# Patient Record
Sex: Female | Born: 1945 | Race: Black or African American | Hispanic: No | Smoking: Former smoker
Health system: Southern US, Community
[De-identification: ages and names within clinical notes are randomized; demographics above are authoritative.]

## PROBLEM LIST (undated history)

## (undated) DIAGNOSIS — R0600 Dyspnea, unspecified: Secondary | ICD-10-CM

## (undated) DIAGNOSIS — J45909 Unspecified asthma, uncomplicated: Secondary | ICD-10-CM

## (undated) DIAGNOSIS — I10 Essential (primary) hypertension: Secondary | ICD-10-CM

## (undated) DIAGNOSIS — M1990 Unspecified osteoarthritis, unspecified site: Secondary | ICD-10-CM

## (undated) DIAGNOSIS — C801 Malignant (primary) neoplasm, unspecified: Secondary | ICD-10-CM

## (undated) DIAGNOSIS — M797 Fibromyalgia: Secondary | ICD-10-CM

## (undated) DIAGNOSIS — G5600 Carpal tunnel syndrome, unspecified upper limb: Secondary | ICD-10-CM

## (undated) DIAGNOSIS — E119 Type 2 diabetes mellitus without complications: Secondary | ICD-10-CM

## (undated) DIAGNOSIS — K5792 Diverticulitis of intestine, part unspecified, without perforation or abscess without bleeding: Secondary | ICD-10-CM

## (undated) DIAGNOSIS — T8859XA Other complications of anesthesia, initial encounter: Secondary | ICD-10-CM

## (undated) HISTORY — PX: ABDOMINAL HYSTERECTOMY: SHX81

## (undated) HISTORY — PX: TONSILLECTOMY: SUR1361

## (undated) HISTORY — PX: CHOLECYSTECTOMY: SHX55

## (undated) HISTORY — PX: KNEE SURGERY: SHX244

---

## 2008-01-22 ENCOUNTER — Ambulatory Visit: Payer: Self-pay | Admitting: Diagnostic Radiology

## 2008-01-22 ENCOUNTER — Emergency Department (HOSPITAL_BASED_OUTPATIENT_CLINIC_OR_DEPARTMENT_OTHER): Admission: EM | Admit: 2008-01-22 | Discharge: 2008-01-22 | Payer: Self-pay | Admitting: Emergency Medicine

## 2010-02-08 ENCOUNTER — Encounter: Payer: Self-pay | Admitting: Internal Medicine

## 2010-05-04 LAB — CBC
HCT: 44.3 % (ref 36.0–46.0)
MCV: 89.7 fL (ref 78.0–100.0)
RBC: 4.93 MIL/uL (ref 3.87–5.11)
WBC: 7.1 10*3/uL (ref 4.0–10.5)

## 2010-05-04 LAB — DIFFERENTIAL
Eosinophils Absolute: 0.1 10*3/uL (ref 0.0–0.7)
Eosinophils Relative: 2 % (ref 0–5)
Lymphs Abs: 2.7 10*3/uL (ref 0.7–4.0)
Monocytes Relative: 7 % (ref 3–12)

## 2010-05-04 LAB — BASIC METABOLIC PANEL
BUN: 12 mg/dL (ref 6–23)
Chloride: 107 mEq/L (ref 96–112)
GFR calc Af Amer: 55 mL/min — ABNORMAL LOW (ref >60)
Potassium: 4 mEq/L (ref 3.5–5.1)

## 2011-11-27 ENCOUNTER — Other Ambulatory Visit: Payer: Self-pay | Admitting: Family Medicine

## 2015-03-24 ENCOUNTER — Encounter: Payer: Self-pay | Admitting: Emergency Medicine

## 2015-03-24 ENCOUNTER — Emergency Department: Payer: Medicare Other

## 2015-03-24 ENCOUNTER — Emergency Department
Admission: EM | Admit: 2015-03-24 | Discharge: 2015-03-25 | Disposition: A | Discharge status: Home / Self Care | Payer: Medicare Other | Attending: Emergency Medicine | Admitting: Emergency Medicine

## 2015-03-24 DIAGNOSIS — J111 Influenza due to unidentified influenza virus with other respiratory manifestations: Secondary | ICD-10-CM | POA: Insufficient documentation

## 2015-03-24 DIAGNOSIS — R509 Fever, unspecified: Secondary | ICD-10-CM | POA: Diagnosis present

## 2015-03-24 DIAGNOSIS — E119 Type 2 diabetes mellitus without complications: Secondary | ICD-10-CM | POA: Insufficient documentation

## 2015-03-24 DIAGNOSIS — I10 Essential (primary) hypertension: Secondary | ICD-10-CM | POA: Diagnosis not present

## 2015-03-24 HISTORY — DX: Unspecified osteoarthritis, unspecified site: M19.90

## 2015-03-24 HISTORY — DX: Carpal tunnel syndrome, unspecified upper limb: G56.00

## 2015-03-24 HISTORY — DX: Diverticulitis of intestine, part unspecified, without perforation or abscess without bleeding: K57.92

## 2015-03-24 HISTORY — DX: Type 2 diabetes mellitus without complications: E11.9

## 2015-03-24 HISTORY — DX: Essential (primary) hypertension: I10

## 2015-03-24 HISTORY — DX: Fibromyalgia: M79.7

## 2015-03-24 LAB — CBC WITH DIFFERENTIAL/PLATELET
BASOS PCT: 1 %
Basophils Absolute: 0 10*3/uL (ref 0–0.1)
EOS ABS: 0 10*3/uL (ref 0–0.7)
EOS PCT: 1 %
HCT: 44.1 % (ref 35.0–47.0)
Hemoglobin: 14.7 g/dL (ref 12.0–16.0)
Lymphocytes Relative: 7 %
Lymphs Abs: 0.5 10*3/uL — ABNORMAL LOW (ref 1.0–3.6)
MCH: 29.2 pg (ref 26.0–34.0)
MCHC: 33.2 g/dL (ref 32.0–36.0)
MCV: 87.8 fL (ref 80.0–100.0)
MONO ABS: 0.9 10*3/uL (ref 0.2–0.9)
MONOS PCT: 13 %
Neutro Abs: 5.8 10*3/uL (ref 1.4–6.5)
Neutrophils Relative %: 78 %
PLATELETS: 180 10*3/uL (ref 150–440)
RBC: 5.03 MIL/uL (ref 3.80–5.20)
RDW: 14.7 % — AB (ref 11.5–14.5)
WBC: 7.3 10*3/uL (ref 3.6–11.0)

## 2015-03-24 LAB — URINALYSIS COMPLETE WITH MICROSCOPIC (ARMC ONLY)
Bacteria, UA: NONE SEEN
Bilirubin Urine: NEGATIVE
Glucose, UA: NEGATIVE mg/dL
Hgb urine dipstick: NEGATIVE
KETONES UR: NEGATIVE mg/dL
LEUKOCYTES UA: NEGATIVE
Nitrite: NEGATIVE
PH: 7 (ref 5.0–8.0)
PROTEIN: NEGATIVE mg/dL
RBC / HPF: NONE SEEN RBC/hpf (ref 0–5)
SPECIFIC GRAVITY, URINE: 1.014 (ref 1.005–1.030)

## 2015-03-24 LAB — COMPREHENSIVE METABOLIC PANEL
ALBUMIN: 4.1 g/dL (ref 3.5–5.0)
ALT: 21 U/L (ref 14–54)
ANION GAP: 9 (ref 5–15)
AST: 24 U/L (ref 15–41)
Alkaline Phosphatase: 63 U/L (ref 38–126)
BILIRUBIN TOTAL: 0.9 mg/dL (ref 0.3–1.2)
BUN: 17 mg/dL (ref 6–20)
CO2: 23 mmol/L (ref 22–32)
Calcium: 9.1 mg/dL (ref 8.9–10.3)
Chloride: 109 mmol/L (ref 101–111)
Creatinine, Ser: 1.45 mg/dL — ABNORMAL HIGH (ref 0.44–1.00)
GFR calc Af Amer: 41 mL/min — ABNORMAL LOW (ref >60)
GFR calc non Af Amer: 35 mL/min — ABNORMAL LOW (ref >60)
GLUCOSE: 117 mg/dL — AB (ref 65–99)
POTASSIUM: 3.9 mmol/L (ref 3.5–5.1)
SODIUM: 141 mmol/L (ref 135–145)
TOTAL PROTEIN: 7.5 g/dL (ref 6.5–8.1)

## 2015-03-24 MED ORDER — ACETAMINOPHEN 325 MG PO TABS
650.0000 mg | ORAL_TABLET | Freq: Once | ORAL | Status: AC
  Administered 2015-03-24: 650 mg via ORAL
  Filled 2015-03-24: qty 2

## 2015-03-24 MED ORDER — OSELTAMIVIR PHOSPHATE 75 MG PO CAPS: 75.0000 mg | ORAL_CAPSULE | Freq: Two times a day (BID) | ORAL | Status: DC

## 2015-03-24 MED ORDER — DIPHENHYDRAMINE HCL 25 MG PO CAPS
25.0000 mg | ORAL_CAPSULE | Freq: Once | ORAL | Status: AC
  Administered 2015-03-24: 25 mg via ORAL
  Filled 2015-03-24: qty 1

## 2015-03-24 MED ORDER — OSELTAMIVIR PHOSPHATE 75 MG PO CAPS
75.0000 mg | ORAL_CAPSULE | Freq: Once | ORAL | Status: AC
  Administered 2015-03-24: 75 mg via ORAL
  Filled 2015-03-24: qty 1

## 2015-03-24 MED ORDER — IBUPROFEN 400 MG PO TABS: 400.0000 mg | ORAL_TABLET | Freq: Once | ORAL | Status: DC

## 2015-03-24 NOTE — Discharge Instructions (Signed)
Influenza, Adult Influenza (flu) is an infection in the mouth, nose, and throat (respiratory tract) caused by a virus. The flu can make you feel very ill. Influenza spreads easily from person to person (contagious).  HOME CARE   Only take medicines as told by your doctor.  Use a cool mist humidifier to make breathing easier.  Get plenty of rest until your fever goes away. This usually takes 3 to 4 days.  Drink enough fluids to keep your pee (urine) clear or pale yellow.  Cover your mouth and nose when you cough or sneeze.  Wash your hands well to avoid spreading the flu.  Stay home from work or school until your fever has been gone for at least 1 full day.  Get a flu shot every year. GET HELP RIGHT AWAY IF:   You have trouble breathing or feel short of breath.  Your skin or nails turn blue.  You have severe neck pain or stiffness.  You have a severe headache, facial pain, or earache.  Your fever gets worse or keeps coming back.  You feel sick to your stomach (nauseous), throw up (vomit), or have watery poop (diarrhea).  You have chest pain.  You have a deep cough that gets worse, or you cough up more thick spit (mucus). MAKE SURE YOU:   Understand these instructions.  Will watch your condition.  Will get help right away if you are not doing well or get worse.   This information is not intended to replace advice given to you by your health care provider. Make sure you discuss any questions you have with your health care provider.   Document Released: 10/14/2007 Document Revised: 01/25/2014 Document Reviewed: 04/05/2011 Elsevier Interactive Patient Education 2016 Elsevier Inc.  

## 2015-03-24 NOTE — ED Provider Notes (Signed)
[facility] Emergency Department Provider Note  ____________________________________________  Time seen: Approximately 950 PM  I have reviewed the triage vital signs and the nursing notes.   HISTORY  Chief Complaint Fever and Headache    HPI Virginia Hunt is a 70 y.o. female with a history of hypertension and diabetes who is presenting to the emergency department today with fever as well as body aches that started this morning. She says that she is also a runny nose and a cough. She admits to multiple sick contacts at home with what sounds like viral illnesses. She says that she has a diffuse headache which is aching as well as pressure to her forehead and face which she feels like similar to previous sinus issues. She denies any nausea, vomiting or chest pain. She has not taken any Tylenol or ibuprofen because of previous allergies of itching. She denies ever having swelling to her face or difficulty breathing after taking his medications. Says that she did get her flu shot earlier this year.Patient able to range her head freely but says that she does have some neck pain.   Past Medical History  Diagnosis Date  . Diabetes mellitus without complication (HCC)     pre diabetes  . Hypertension   . Arthritis   . Fibromyalgia   . Diverticulitis   . Carpal tunnel syndrome     There are no active problems to display for this patient.   Past Surgical History  Procedure Laterality Date  . Abdominal hysterectomy    . Tonsillectomy    . Cholecystectomy      No current outpatient prescriptions on file.  Allergies Codeine; Ibuprofen; Morphine and related; and Tylenol  No family history on file.  Social History Social History  Substance Use Topics  . Smoking status: Never Smoker   . Smokeless tobacco: Not on file  . Alcohol Use: No    Review of Systems Constitutional:  Fever and chills that started this morning. Eyes: No visual changes. ENT: No  sore throat. Cardiovascular: Denies chest pain. Respiratory: Denies shortness of breath. Gastrointestinal: No abdominal pain.  No nausea, no vomiting.  No diarrhea.  No constipation. Genitourinary: Negative for dysuria. Musculoskeletal: Admits to pain up and down her back Skin: Negative for rash. Neurological: Negative for focal weakness or numbness.  10-point ROS otherwise negative.  ____________________________________________   PHYSICAL EXAM:  VITAL SIGNS: ED Triage Vitals  Enc Vitals Group     BP 03/24/15 1749 157/83 mmHg     Pulse Rate 03/24/15 1747 106     Resp 03/24/15 1747 18     Temp 03/24/15 1747 100.4 F (38 C)     Temp Source 03/24/15 1747 Oral     SpO2 03/24/15 1747 95 %     Weight 03/24/15 1747 230 lb (104.327 kg)     Height 03/24/15 1747 5\' 7"  (1.702 m)     Head Cir --      Peak Flow --      Pain Score 03/24/15 1749 10     Pain Loc --      Pain Edu? --      Excl. in GC? --     Constitutional: Alert and oriented. in no acute distress. Eyes: Conjunctivae are normal. PERRL. EOMI. Head: Atraumatic. Nose: Bilateral clear rhinorrhea with erythematous nasal mucosa. Mouth/Throat: Mucous membranes are moist.   Neck: No stridor.  Nontender. Able to range freely. No signs of meningismus. Cardiovascular: Normal rate, regular rhythm. Grossly normal heart  sounds.  Good peripheral circulation. Respiratory: Normal respiratory effort.  No retractions. Lungs CTAB. Gastrointestinal: Soft and nontender. No distention. No abdominal bruits. No CVA tenderness. Musculoskeletal: No lower extremity tenderness nor edema.  No joint effusions. Neurologic:  Normal speech and language. No gross focal neurologic deficits are appreciated.  Skin:  Skin is warm, dry and intact. No rash noted. Psychiatric: Mood and affect are normal. Speech and behavior are normal.  ____________________________________________   LABS (all labs ordered are listed, but only abnormal results are  displayed)  Labs Reviewed  CBC WITH DIFFERENTIAL/PLATELET - Abnormal; Notable for the following:    RDW 14.7 (*)    Lymphs Abs 0.5 (*)    All other components within normal limits  COMPREHENSIVE METABOLIC PANEL - Abnormal; Notable for the following:    Glucose, Bld 117 (*)    Creatinine, Ser 1.45 (*)    GFR calc non Af Amer 35 (*)    GFR calc Af Amer 41 (*)    All other components within normal limits  URINALYSIS COMPLETEWITH MICROSCOPIC ([facility] ONLY) - Abnormal; Notable for the following:    Color, Urine YELLOW (*)    APPearance CLEAR (*)    Squamous Epithelial / LPF 0-5 (*)    All other components within normal limits   ____________________________________________  EKG   ____________________________________________  RADIOLOGY  No acute findings on the chest x-ray. ____________________________________________   PROCEDURES   ____________________________________________   INITIAL IMPRESSION / ASSESSMENT AND PLAN / ED COURSE  Pertinent labs & imaging results that were available during my care of the patient were reviewed by me and considered in my medical decision making (see chart for details).  Patient appears to have a flulike illness. Not consistent with meningitis.  ----------------------------------------- 11:20 PM on 03/24/2015 -----------------------------------------  Patient tolerated Tylenol well without any reaction. She says that her pain is reduced. I retook her temperature and it was 98.2 orally. Heart rate is now 86. She'll be discharged home with Tamiflu. I'll be treating her empirically. ____________________________________________   FINAL CLINICAL IMPRESSION(S) / ED DIAGNOSES  Influenza.    Myrna Blazer, MD 03/24/15 [phone]

## 2015-03-24 NOTE — ED Notes (Signed)
C/o headache. Onset of symptoms this morning.  C/o headache, face pain, jaw, low back pain.

## 2015-03-24 NOTE — ED Notes (Signed)
MD at bedside. 

## 2015-03-24 NOTE — ED Notes (Signed)
Pharmacy called for tamiflu

## 2015-03-24 NOTE — ED Notes (Signed)
Registration at bedside.

## 2015-03-25 NOTE — ED Notes (Signed)
Pt discharged to home.  Family member driving.  Discharge instructions reviewed.  Verbalized understanding.  No questions or concerns at this time.  Teach back verified.  Pt in NAD.  No items left in ED.   

## 2016-08-17 ENCOUNTER — Emergency Department
Admission: EM | Admit: 2016-08-17 | Discharge: 2016-08-17 | Disposition: A | Discharge status: Home / Self Care | Payer: Medicare Other | Attending: Emergency Medicine | Admitting: Emergency Medicine

## 2016-08-17 ENCOUNTER — Encounter: Payer: Self-pay | Admitting: Emergency Medicine

## 2016-08-17 DIAGNOSIS — E119 Type 2 diabetes mellitus without complications: Secondary | ICD-10-CM | POA: Diagnosis not present

## 2016-08-17 DIAGNOSIS — K047 Periapical abscess without sinus: Secondary | ICD-10-CM | POA: Diagnosis not present

## 2016-08-17 DIAGNOSIS — H5711 Ocular pain, right eye: Secondary | ICD-10-CM | POA: Diagnosis not present

## 2016-08-17 DIAGNOSIS — K0889 Other specified disorders of teeth and supporting structures: Secondary | ICD-10-CM | POA: Diagnosis present

## 2016-08-17 DIAGNOSIS — H00012 Hordeolum externum right lower eyelid: Secondary | ICD-10-CM

## 2016-08-17 DIAGNOSIS — I10 Essential (primary) hypertension: Secondary | ICD-10-CM | POA: Insufficient documentation

## 2016-08-17 MED ORDER — FLUCONAZOLE 150 MG PO TABS: 150.0000 mg | ORAL_TABLET | Freq: Every day | ORAL | 0 refills | Status: DC

## 2016-08-17 MED ORDER — AMOXICILLIN 500 MG PO CAPS
500.0000 mg | ORAL_CAPSULE | Freq: Once | ORAL | Status: AC
  Administered 2016-08-17: 500 mg via ORAL
  Filled 2016-08-17: qty 1

## 2016-08-17 MED ORDER — CHLORHEXIDINE GLUCONATE 0.12 % MT SOLN: 15.0000 mL | Freq: Two times a day (BID) | OROMUCOSAL | 0 refills | Status: DC

## 2016-08-17 MED ORDER — AMOXICILLIN 500 MG PO TABS: 500.0000 mg | ORAL_TABLET | Freq: Three times a day (TID) | ORAL | 0 refills | Status: DC

## 2016-08-17 NOTE — Discharge Instructions (Signed)
OPTIONS FOR DENTAL FOLLOW UP CARE ° °[place] Department of Health and Human Services - Local Safety Net Dental Clinics °http://www.ncdhhs.gov/dph/oralhealth/services/safetynetclinics.htm °  °[facility] (336-562-3123) ° °[facility] [place] (919-933-9087) ° °[facility] ([phone] ext [phone]) ° °[facility] ([phone]) ° °[facility] ([phone]) °This clinic caters to the indigent population and is on a lottery system. °Location: °[facility], [address], [place] °Clinic Hours: °Wednesdays from 6pm - 9pm, patients seen by a lottery system. °For dates, call or go to [email or web address].[facility].edu/[facility]ac/patients/Dental-[facility] °Services: °Cleanings, fillings and simple extractions. °Payment Options: °DENTAL WORK IS FREE OF CHARGE. Bring proof of income or support. °Best way to get seen: °Arrive at 5:15 pm - this is a lottery, NOT first come/first serve, so arriving earlier will not increase your chances of being seen. °  °  °[facility] °[phone] °Select option 1 for emergencies °  °Location: °[facility], [address], [place] °Clinic Hours: °No walk-ins accepted - call the day before to schedule an appointment. °Check in times are 9:30 am and 1:30 pm. °Services: °Simple extractions, temporary fillings, pulpectomy/pulp debridement, uncomplicated abscess drainage. °Payment Options: °PAYMENT IS DUE AT THE TIME OF SERVICE.  Fee is usually $100-200, additional surgical procedures (e.g. abscess drainage) may be extra. °Cash, checks, Visa/MasterCard accepted.  Can file Medicaid if patient is covered for dental - patient should call case worker to check. °No discount for [facility] patients. °Best way to get seen: °MUST call the day before and get onto the schedule. Can usually be seen the next 1-2 days. No walk-ins accepted. °  °  °[facility] °[phone] °   °Location: °[facility], [address], [place] °Clinic Hours: °M, W, Th, F 8am or 1:30pm, Tues 9a or 1:30 - first come/first served. °Services: °Simple extractions, temporary fillings, uncomplicated abscess drainage.  You do not need to be an [place] resident. °Payment Options: °PAYMENT IS DUE AT THE TIME OF SERVICE. °Dental insurance, otherwise sliding scale - bring proof of income or support. °Depending on income and treatment needed, cost is usually $50-200. °Best way to get seen: °Arrive early as it is first come/first served. °  °  °[facility] °[phone] °  °Location: °[address] °Clinic Hours: °Mon-Thu 8a-5p °Services: °Most basic dental services including extractions and fillings. °Payment Options: °PAYMENT IS DUE AT THE TIME OF SERVICE. °Sliding scale, up to 50% off - bring proof if income or support. °Medicaid with dental option accepted. °Best way to get seen: °Call to schedule an appointment, can usually be seen within 2 weeks OR they will try to see walk-ins - show up at 8a or 2p (you may have to wait). °  °  °[facility] °[phone] °[place] RESIDENTS ONLY °  °Location: °[facility], [address], [place], [place] [address] °Clinic Hours: By appointment only. °Monday - Thursday 8am-5pm, Friday 8am-12pm °Services: Cleanings, fillings, extractions. °Payment Options: °PAYMENT IS DUE AT THE TIME OF SERVICE. °Cash, Visa or MasterCard. Sliding scale - $30 minimum per service. °Best way to get seen: °Come in to office, complete packet and make an appointment - need proof of income °or support monies for each household member and proof of [place] residence. °Usually takes about a month to get in. °  °  °[facility] °[phone] °  °Location: °[address]   [place] °Clinic Hours: Walk-in Urgent Care Dental Services are offered Monday-Friday  mornings only. °The numbers of emergencies accepted daily is limited to the number of °providers available. °Maximum 15 - Mondays, Wednesdays & Thursdays °Maximum 10 - Tuesdays & Fridays °Services: °You do not need to be a [place] resident to be seen for a dental emergency. °Emergencies are defined as pain, swelling, abnormal bleeding, or dental trauma. Walkins will receive x-rays if needed. °NOTE: Dental cleaning is not an emergency. °Payment Options: °PAYMENT IS DUE AT THE TIME OF SERVICE. °Minimum co-pay is $40.00 for uninsured patients. °Minimum co-pay is $3.00 for Medicaid with dental coverage. °Dental Insurance is accepted and must be presented at time of visit. °Medicare does not cover dental. °Forms of payment: Cash, credit card, checks. °Best way to get seen: °If not previously registered with the clinic, walk-in dental registration begins at 7:15 am and is on a first come/first serve basis. °If previously registered with the clinic, call to make an appointment. °  °  °[facility] °[phone] °[place] RESIDENTS ONLY °  °Location: °[address], [place], [place] °Clinic Hours: °Mon-Thu 10a-2p °Services: Extractions only! °Payment Options: °FREE (donations accepted) - bring proof of income or support °Best way to get seen: °Call and schedule an appointment OR come at 8am on the 1st Monday of every month (except for holidays) when it is first come/first served. °  °  °[facility] °[phone] °  °Location: °[address], [place] °Clinic Hours: °Friday mornings °Services, Payment Options, Best way to get seen: °Call for info °

## 2016-08-17 NOTE — ED Notes (Signed)
Pt stating "I thought I bit the inside of my cheek the other day." Pt stating she started with pain and noticed a "lump" today. Pt has some swelling noted to right face. Pain is in lower right lower jaw. Pt stating that she knows she has a "bad tooth." Pt also stating that she also has a HA that has been going on the last two day. Pt stating "I think I have a stye in my right eye." Pt stating that she has not taken any medications to help with pain control

## 2016-08-17 NOTE — ED Provider Notes (Signed)
[facility] Emergency Department Provider Note ____________________________________________  Time seen: Approximately 8:17 PM  I have reviewed the triage vital signs and the nursing notes.   HISTORY  Chief Complaint Dental Pain   HPI Virginia Hunt is a 71 y.o. female who presents to the emergency department for evaluation of dental pain and swelling of the right lower eyelid. This pain started approximately 4 days ago, but she awakened this morning with swelling in the right lower jaw. She has rinsed with peroxide, warm salt water, and antibacterial mouthwash and rotation without any relief. She states she may have had a low-grade fever couple of days ago. She knows that she has a broken tooth in the same general area of the jaw swelling.  Past Medical History:  Diagnosis Date  . Arthritis   . Carpal tunnel syndrome   . Diabetes mellitus without complication (HCC)    pre diabetes  . Diverticulitis   . Fibromyalgia   . Hypertension     There are no active problems to display for this patient.   Past Surgical History:  Procedure Laterality Date  . ABDOMINAL HYSTERECTOMY    . CHOLECYSTECTOMY    . TONSILLECTOMY      Prior to Admission medications   Medication Sig Start Date End Date Taking? Authorizing Provider  amoxicillin (AMOXIL) 500 MG tablet Take 1 tablet (500 mg total) by mouth 3 (three) times daily. 08/17/16   Debbe Crumble B, FNP  chlorhexidine (PERIDEX) 0.12 % solution Use as directed 15 mLs in the mouth or throat 2 (two) times daily. 08/17/16   Jobanny Mavis, Rulon Eisenmengerari B, FNP  fluconazole (DIFLUCAN) 150 MG tablet Take 1 tablet (150 mg total) by mouth daily. 08/17/16   Rusty Villella, Rulon Eisenmengerari B, FNP  oseltamivir (TAMIFLU) 75 MG capsule Take 1 capsule (75 mg total) by mouth 2 (two) times daily. 03/24/15   Schaevitz, Myra Rudeavid Matthew, MD    Allergies Codeine; Ibuprofen; Morphine and related; and Tylenol [acetaminophen]  No family history on file.  Social  History Social History  Substance Use Topics  . Smoking status: Never Smoker  . Smokeless tobacco: Never Used  . Alcohol use No    Review of Systems Constitutional: Negative for recent illness or injury. HEENT: Positive for lesion at the right lower eyelid. Positive for dental pain. Musculoskeletal: Negative for myalgias.  Skin: Positive for swelling over the right mandible ____________________________________________   PHYSICAL EXAM:  VITAL SIGNS: ED Triage Vitals  Enc Vitals Group     BP 08/17/16 1849 (!) 188/90     Pulse Rate 08/17/16 1849 78     Resp 08/17/16 1849 16     Temp 08/17/16 1849 98.4 F (36.9 C)     Temp Source 08/17/16 1849 Oral     SpO2 08/17/16 1849 95 %     Weight 08/17/16 1848 230 lb (104.3 kg)     Height 08/17/16 1848 5\' 7"  (1.702 m)     Head Circumference --      Peak Flow --      Pain Score 08/17/16 1848 10     Pain Loc --      Pain Edu? --      Excl. in GC? --     Constitutional: Alert and oriented. Well appearing and in no acute distress. Eyes: Conjunctivae are clear without discharge or drainage.Hordeolum is noted on the external right lower eyelid Mouth/Throat: Oropharynx is normal. Periodontal Exam    Hematological/Lymphatic/Immunilogical: No palpable anterior cervical lymphadenopathy on exam. Respiratory: Respiratory rate  is even and unlabored. Musculoskeletal: Full, active range of motion is observed. Neurologic: Awake, alert, and oriented 4.  Skin:  Mildly erythematous area palpable over the right mandible. No fluctuance is demonstrated. Psychiatric: Affect and behavior are normal.  ____________________________________________   LABS (all labs ordered are listed, but only abnormal results are displayed)  Labs Reviewed - No data to display ____________________________________________   RADIOLOGY  Not indicated ____________________________________________   PROCEDURES  Procedure(s) performed: None  Critical Care  performed: No ____________________________________________   INITIAL IMPRESSION / ASSESSMENT AND PLAN / ED COURSE  Virginia AcostaSaundra Garton is a 71 y.o. female who presents to the emergency department for treatment and evaluation of a hordeolum on the right eyelid as well as dental pain and infection. She will be treated with amoxicillin for the dental infection. She was also given a prescription for Diflucan as she states that she frequently gets yeast infections while taking amoxicillin. She was advised that the hordeolum should resolve without treatment, but she was encouraged to clean her face and eyelashes twice per day. She was encouraged to call and schedule a follow-up with the dentist within the next 2 weeks. She was encouraged to return to the emergency department for symptoms that change or worsen if she is unable schedule an appointment.  Pertinent labs & imaging results that were available during my care of the patient were reviewed by me and considered in my medical decision making (see chart for details).  ____________________________________________   FINAL CLINICAL IMPRESSION(S) / ED DIAGNOSES  Final diagnoses:  Dental abscess  Hordeolum externum of right lower eyelid    New Prescriptions   AMOXICILLIN (AMOXIL) 500 MG TABLET    Take 1 tablet (500 mg total) by mouth 3 (three) times daily.   CHLORHEXIDINE (PERIDEX) 0.12 % SOLUTION    Use as directed 15 mLs in the mouth or throat 2 (two) times daily.   FLUCONAZOLE (DIFLUCAN) 150 MG TABLET    Take 1 tablet (150 mg total) by mouth daily.    If controlled substance prescribed during this visit, 12 month history viewed on the [facility] prior to issuing an initial prescription for Schedule II or III opiod.  Note:  This document was prepared using Dragon voice recognition software and may include unintentional dictation errors.    Chinita Pesterriplett, Jovahn Breit B, FNP 08/17/16 2043    Nita SickleVeronese, [place], MD 08/17/16 [phone]

## 2016-08-17 NOTE — ED Triage Notes (Addendum)
Left lower jaw swelling, headache, fever -- x 4 days.  Patient states she has a bad tooth where the pain and swelling is, but is concerned she has caught hand, foot, mouth disease from her granddaughter.

## 2018-05-26 ENCOUNTER — Ambulatory Visit: Payer: Medicare Other

## 2018-05-26 ENCOUNTER — Ambulatory Visit
Admission: EM | Admit: 2018-05-26 | Discharge: 2018-05-26 | Disposition: A | Discharge status: Home / Self Care | Payer: Medicare Other | Attending: Family Medicine | Admitting: Family Medicine

## 2018-05-26 ENCOUNTER — Other Ambulatory Visit: Payer: Self-pay

## 2018-05-26 ENCOUNTER — Encounter: Payer: Self-pay | Admitting: Emergency Medicine

## 2018-05-26 DIAGNOSIS — M25561 Pain in right knee: Secondary | ICD-10-CM

## 2018-05-26 DIAGNOSIS — Z87891 Personal history of nicotine dependence: Secondary | ICD-10-CM | POA: Diagnosis not present

## 2018-05-26 DIAGNOSIS — Z7901 Long term (current) use of anticoagulants: Secondary | ICD-10-CM | POA: Diagnosis not present

## 2018-05-26 DIAGNOSIS — M797 Fibromyalgia: Secondary | ICD-10-CM | POA: Insufficient documentation

## 2018-05-26 DIAGNOSIS — I10 Essential (primary) hypertension: Secondary | ICD-10-CM | POA: Insufficient documentation

## 2018-05-26 DIAGNOSIS — Z79899 Other long term (current) drug therapy: Secondary | ICD-10-CM | POA: Insufficient documentation

## 2018-05-26 DIAGNOSIS — M1711 Unilateral primary osteoarthritis, right knee: Secondary | ICD-10-CM | POA: Diagnosis not present

## 2018-05-26 DIAGNOSIS — G5600 Carpal tunnel syndrome, unspecified upper limb: Secondary | ICD-10-CM | POA: Diagnosis not present

## 2018-05-26 DIAGNOSIS — W19XXXA Unspecified fall, initial encounter: Secondary | ICD-10-CM | POA: Insufficient documentation

## 2018-05-26 MED ORDER — PREDNISONE 20 MG PO TABS: 40.0000 mg | ORAL_TABLET | Freq: Every day | ORAL | 0 refills | Status: DC

## 2018-05-26 MED ORDER — MELOXICAM 7.5 MG PO TABS: 7.5000 mg | ORAL_TABLET | Freq: Every day | ORAL | 0 refills | Status: DC

## 2018-05-26 NOTE — ED Triage Notes (Signed)
Pt c/o right knee pain. She states that she stepped and felt a sharp pain in her knee down her leg and up to her buttock. She can not bear any weight and she heard a popping sound.

## 2018-05-26 NOTE — ED Provider Notes (Signed)
[address] [place], [place] [address] [phone]   Name: Virginia Hunt DOB: 09-May-1945 MRN: [id number] CSN: [id number] PCP: [facility]  Arrival date and time:  05/26/18 1608  Chief Complaint:  Knee Pain (right)  NOTE: Prior to seeing the patient today, I have reviewed the triage nursing documentation and vital signs. Clinical staff has updated patient's PMH/PSHx, current medication list, and drug allergies/intolerances to ensure comprehensive history available to assist in medical decision making.   History:   HPI: Virginia Hunt is a 73 y.o. female who presents today with complaints of pain in her RIGHT knee x 2 days. Patient with chronic pain in her knees, however notes that the pain is worse. She notes that she was going down steps today and felt/heard a pop, which caused her to call to the ground. She denies other associated injuries. Following her fall, patient has radiated from knee into toes. She notes difficulty with bearing weight. No numbness or tingling.   Patient advises that she she receives regular cortisone injections in her knee; administered by PCP. She does not routinely see orthopedics.   Past Medical History:  Diagnosis Date  . Arthritis   . Carpal tunnel syndrome   . Diabetes mellitus without complication (HCC)    pre diabetes  . Diverticulitis   . Fibromyalgia   . Hypertension     Past Surgical History:  Procedure Laterality Date  . ABDOMINAL HYSTERECTOMY    . CHOLECYSTECTOMY    . TONSILLECTOMY      Family History  Problem Relation Age of Onset  . Stroke Father   . COPD Father     Social History   Socioeconomic History  . Marital status: Widowed    Spouse name: Not on file  . Number of children: Not on file  . Years of education: Not on file  . Highest education level: Not on file  Occupational History  . Not on file  Social Needs  . Financial resource strain: Not on file  . Food insecurity:    Worry:  Not on file    Inability: Not on file  . Transportation needs:    Medical: Not on file    Non-medical: Not on file  Tobacco Use  . Smoking status: Former Games developer  . Smokeless tobacco: Never Used  Substance and Sexual Activity  . Alcohol use: No  . Drug use: No  . Sexual activity: Not on file  Lifestyle  . Physical activity:    Days per week: Not on file    Minutes per session: Not on file  . Stress: Not on file  Relationships  . Social connections:    Talks on phone: Not on file    Gets together: Not on file    Attends religious service: Not on file    Active member of club or organization: Not on file    Attends meetings of clubs or organizations: Not on file    Relationship status: Not on file  . Intimate partner violence:    Fear of current or ex partner: Not on file    Emotionally abused: Not on file    Physically abused: Not on file    Forced sexual activity: Not on file  Other Topics Concern  . Not on file  Social History Narrative  . Not on file    There are no active problems to display for this patient.   Home Medications:    Current Meds  Medication Sig  .  Cetirizine HCl (ZYRTEC ALLERGY) 10 MG CAPS Take by mouth.  . fluticasone (FLONASE) 50 MCG/ACT nasal spray Place into the nose.  . losartan (COZAAR) 100 MG tablet Take by mouth.    Allergies:   Codeine; Ibuprofen; Morphine and related; and Tylenol [acetaminophen]  Review of Systems (ROS): Review of Systems  Constitutional: Negative for chills and fever.  Respiratory: Negative for cough and shortness of breath.   Cardiovascular: Negative for chest pain and palpitations.  Musculoskeletal: Positive for gait problem and joint swelling. Negative for back pain.       RIGHT knee pain s/p fall  Neurological: Positive for weakness (2/2 acute worsening of pain in RLE). Negative for dizziness and numbness.  All other systems reviewed and are negative.    Physical Exam:  Triage Vital Signs ED Triage  Vitals  Enc Vitals Group     BP 05/26/18 1627 (!) 175/86     Pulse Rate 05/26/18 1627 67     Resp 05/26/18 1627 18     Temp 05/26/18 1627 98.4 F (36.9 C)     Temp Source 05/26/18 1627 Oral     SpO2 05/26/18 1627 99 %     Weight 05/26/18 1622 225 lb (102.1 kg)     Height 05/26/18 1622 5' 7.5" (1.715 m)     Head Circumference --      Peak Flow --      Pain Score 05/26/18 1622 10     Pain Loc --      Pain Edu? --      Excl. in GC? --     Physical Exam  Constitutional: She is oriented to person, place, and time and well-developed, well-nourished, and in no distress.  HENT:  Head: Normocephalic and atraumatic.  Mouth/Throat: Mucous membranes are normal.  Eyes: Pupils are equal, round, and reactive to light. EOM are normal.  Neck: Normal range of motion. Neck supple. No tracheal deviation present.  Cardiovascular: Normal rate, regular rhythm, normal heart sounds and intact distal pulses. Exam reveals no gallop and no friction rub.  No murmur heard. Pulmonary/Chest: Effort normal and breath sounds normal. No respiratory distress. She has no wheezes. She has no rales.  Musculoskeletal:     Right knee: She exhibits swelling. She exhibits no ecchymosis, no deformity, no erythema, normal alignment, no LCL laxity, normal meniscus and no MCL laxity. Tenderness (diffuse) found.  Neurological: She is alert and oriented to person, place, and time.  Skin: Skin is warm and dry. No rash noted. No erythema.  Psychiatric: Mood, affect and judgment normal.  Nursing note and vitals reviewed.    Urgent Care Treatments / Results:   LABS: PLEASE NOTE: all labs that were ordered this encounter are listed, however only abnormal results are displayed. Labs Reviewed - No data to display  EKG: No orders found for this or any previous visit.  RADIOLOGY: Dg Knee Complete 4 Views Right  Result Date: 05/26/2018 CLINICAL DATA:  Knee pain. EXAM: RIGHT KNEE - COMPLETE 4+ VIEW COMPARISON:  None. FINDINGS:  They were advance multicompartmental degenerative changes of the right knee, greatest within the medial and patellofemoral compartments. No significant joint effusion. No acute displaced fracture or dislocation. The osseous mineralization is within normal limits. IMPRESSION: 1. No acute displaced fracture or dislocation. 2. Advanced osteoarthritis of the right knee, greatest within the medial and patellofemoral compartments. 3. No large joint effusion. Electronically Signed   By: Katherine Mantle M.D.   On: 05/26/2018 16:53    PRODEDURES: Procedures  MEDICATIONS RECEIVED THIS VISIT: Medications - No data to display  PERTINENT CLINICAL COURSE NOTES/UPDATES: No data to display Clinical Course as of May 26 1738  Fri May 26, 2018  1720 Back in to discuss results and intended POC with patient. Discussed use of meloxicam, however patient states, "I am allergic to all NSAIDs". Limited on viable options in the UC setting. Will discontinue meloxicam Rx and send in short (4 day) course of prednisone. Patient encouraged to see orthopedics to discuss long term management.    [BG]    Clinical Course User Index [BG] Verlee MonteGray, Jernee Murtaugh E, NP   Initial Impression / Assessment and Plan / Urgent Care Course:    Virginia Hunt is a 73 y.o. female [facility] with complaints of Knee Pain (right)  Pertinent labs & imaging results that were available during my care of the patient were personally reviewed by me and considered in my medical decision making (see lab/imaging section of note for values and interpretations).  Patient reports chronic pain in BILATERAL knees. Acute worsening of pain in RIGHT knee today after going down steps and feeling/hearing a pop, which caused her to fall to the ground. No other injuries or complaints of pain following the fall. Patient reports regular cortisone injections in her knees administered by PCP. Diagnostic plain films revealed no acute osseous  process or large joint effusion. It did, however demonstrate advanced degenerative changes consistent with osteoarthritis. Discussed conservative treatment with RICE; ACE wrap applied by nursing staff. Discussed use of anti-inflammatory medication, meloxicam, however patient refused citing that she was allergic to "all NSAID's". Given acute worsening of pain and swelling, will provide a short 4 day course of systemic steroids (Prednisone 40 mg daily x 4 days). Hesitant to provide extended course, or higher dose, as patient advises that steroids have caused her to be hyperglycemic in the past. Encouraged continued heat/ice therapy to help with pain and swelling. Patient needs to see orthopedics. Name and contact information for local provider Joice Lofts(Poggi, MD) provided).   Discussed follow up with primary care physician and/or orthopedics in the next week for re-evaluation. I have reviewed the follow up and strict return precautions for any new or worsening symptoms. Patient is aware of symptoms that would be deemed urgent/emergent, and would thus require further evaluation either here or in the emergency department. At the time of discharge, she verbalized understanding and consent with the discharge plan as it was reviewed with her. All questions were fielded by provider and/or clinic staff prior to patient discharge.    Final Clinical Impressions(s) / Urgent Care Diagnoses:   Final diagnoses:  Acute pain of right knee  Osteoarthritis of right knee, unspecified osteoarthritis type  Fall, initial encounter    New Prescriptions:   Meds ordered this encounter  Medications  . DISCONTD: meloxicam (MOBIC) 7.5 MG tablet    Sig: Take 1 tablet (7.5 mg total) by mouth daily.    Dispense:  30 tablet    Refill:  0  . predniSONE (DELTASONE) 20 MG tablet    Sig: Take 2 tablets (40 mg total) by mouth daily.    Dispense:  8 tablet    Refill:  0    DO NOT FILL the meloxicam - patient refuses.    Controlled  Substance Prescriptions:   Controlled Substance Registry consulted? Not Applicable  NOTE: This note was prepared using Dragon dictation software along with smaller phrase technology. Despite my best ability to proofread, there is the  potential that transcriptional errors may still occur from this process, and are completely unintentional.     Verlee Monte, NP 05/26/18 1747

## 2018-05-26 NOTE — Discharge Instructions (Signed)
It was very nice meeting you today in clinic. Thank you for entrusting me with your care.   As discussed, your xray showed osteoarthritis. Wear ACE wrap for support. We discussed the antiinflammatory medication (mobic), however you were unsure about using the medication due to allergy to ibuprofen. Will give you a short steroid burst to help with the inflammation. Use heat/ice.  Make arrangements to follow up with your regular doctor and/or orthopedics in 1 week for re-evaluation. If your symptoms/condition worsens, please seek follow up care either here or in the ER. Please remember, our [facility] providers are "right here with you" when you need [place].   Again, it was my pleasure to take care of you today. Thank you for choosing our clinic. I hope that you start to feel better quickly.   Quentin Mulling, MSN, APRN, FNP-C, CEN Advanced Practice Provider [facility]

## 2019-03-14 ENCOUNTER — Emergency Department
Admission: EM | Admit: 2019-03-14 | Discharge: 2019-03-14 | Disposition: A | Discharge status: Home / Self Care | Payer: No Typology Code available for payment source | Attending: Emergency Medicine | Admitting: Emergency Medicine

## 2019-03-14 ENCOUNTER — Emergency Department: Payer: No Typology Code available for payment source

## 2019-03-14 ENCOUNTER — Other Ambulatory Visit: Payer: Self-pay

## 2019-03-14 ENCOUNTER — Encounter: Payer: Self-pay | Admitting: Emergency Medicine

## 2019-03-14 DIAGNOSIS — S161XXA Strain of muscle, fascia and tendon at neck level, initial encounter: Secondary | ICD-10-CM | POA: Insufficient documentation

## 2019-03-14 DIAGNOSIS — E119 Type 2 diabetes mellitus without complications: Secondary | ICD-10-CM | POA: Insufficient documentation

## 2019-03-14 DIAGNOSIS — Z79899 Other long term (current) drug therapy: Secondary | ICD-10-CM | POA: Insufficient documentation

## 2019-03-14 DIAGNOSIS — I10 Essential (primary) hypertension: Secondary | ICD-10-CM | POA: Diagnosis not present

## 2019-03-14 DIAGNOSIS — S8001XA Contusion of right knee, initial encounter: Secondary | ICD-10-CM | POA: Diagnosis not present

## 2019-03-14 DIAGNOSIS — G44319 Acute post-traumatic headache, not intractable: Secondary | ICD-10-CM

## 2019-03-14 DIAGNOSIS — Y9389 Activity, other specified: Secondary | ICD-10-CM | POA: Insufficient documentation

## 2019-03-14 DIAGNOSIS — R519 Headache, unspecified: Secondary | ICD-10-CM | POA: Diagnosis present

## 2019-03-14 DIAGNOSIS — Y999 Unspecified external cause status: Secondary | ICD-10-CM | POA: Insufficient documentation

## 2019-03-14 DIAGNOSIS — Y92410 Unspecified street and highway as the place of occurrence of the external cause: Secondary | ICD-10-CM | POA: Diagnosis not present

## 2019-03-14 MED ORDER — MELOXICAM 7.5 MG PO TABS: 7.5000 mg | ORAL_TABLET | Freq: Every day | ORAL | 0 refills | Status: AC

## 2019-03-14 MED ORDER — METHOCARBAMOL 500 MG PO TABS: 500.0000 mg | ORAL_TABLET | Freq: Four times a day (QID) | ORAL | 0 refills | Status: DC
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
## 2019-03-14 NOTE — ED Notes (Signed)
Pt signed e signature.  Discharge inst to pt.   
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
## 2019-07-10 ENCOUNTER — Other Ambulatory Visit: Payer: Self-pay

## 2019-07-10 ENCOUNTER — Ambulatory Visit: Payer: Medicare Other | Attending: Orthopedic Surgery

## 2019-07-10 DIAGNOSIS — R262 Difficulty in walking, not elsewhere classified: Secondary | ICD-10-CM

## 2019-07-10 DIAGNOSIS — M25661 Stiffness of right knee, not elsewhere classified: Secondary | ICD-10-CM | POA: Diagnosis not present

## 2019-07-10 NOTE — Therapy (Signed)
[facility] MAIN [facility] SERVICES [address] [place], [place], [address] Phone: [phone]   Fax:  [phone]  Physical Therapy Evaluation  Patient Details  Name: Virginia Hunt MRN: [id number] Date of Birth: [birth year] Referring Provider (PT): Guerry Bruin MD (orthopedist)   Encounter Date: 07/10/2019   PT End of Session - 07/10/19 1733    Visit Number 1    Number of Visits 16    Date for PT Re-Evaluation 09/04/19    Authorization Type MCR    Authorization Time Period 07/10/19-09/04/19    PT Start Time 1615    PT Stop Time 1710    PT Time Calculation (min) 55 min    Activity Tolerance Patient tolerated treatment well;Patient limited by pain;No increased pain    Behavior During Therapy WFL for tasks assessed/performed           Past Medical History:  Diagnosis Date  . Arthritis   . Carpal tunnel syndrome   . Diabetes mellitus without complication (HCC)    pre diabetes  . Diverticulitis   . Fibromyalgia   . Hypertension     Past Surgical History:  Procedure Laterality Date  . ABDOMINAL HYSTERECTOMY    . CHOLECYSTECTOMY    . TONSILLECTOMY      There were no vitals filed for this visit.    Subjective Assessment - 07/10/19 1624    Subjective Pt presenting to OPPT for evaluation after Rt TKA in May about 5 weeks prior. Pt reports having been working with HHPT, now discharged from their services. Pt has been having a lot more pain than anticipated.    Pertinent History Pt reports Rt TKA on Jun 04, 2019, since has been working wiht HHPT. Pt reports prior to surgery had 'years' of knee pain with cortisone injection therapy for 10-12 years. Pt reports significant limitations in basic mobility. Pt reports having had PT in the past for insidious gait instability, shuffling, LOB. Pt reports this has since resolved.    How long can you sit comfortably? ~30 minutes    How long can you stand comfortably? ~60 minutes    How long can you  walk comfortably? ~10-15 minutes (walking outside in parking lot since surgery)    Currently in Pain? Yes    Pain Score 2     Pain Location Knee   down to bilat ankle   Pain Orientation Right              [facility] PT Assessment - 07/10/19 0001      Assessment   Medical Diagnosis Rt TKA     Referring Provider (PT) Guerry Bruin MD   orthopedist   Onset Date/Surgical Date 06/04/19    Hand Dominance Right    Next MD Visit 07/11/19    Prior Therapy HHPT, now discharged      Precautions   Precautions Knee      Restrictions   Weight Bearing Restrictions Yes    RLE Weight Bearing Weight bearing as tolerated      Balance Screen   Has the patient fallen in the past 6 months No    Has the patient had a decrease in activity level because of a fear of falling?  No    Is the patient reluctant to leave their home because of a fear of falling?  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      Observation/Other Assessments   Observations darkened areas around ankle, more consistent with hemosiderin staining v echymosis    Focus on Therapeutic Outcomes (FOTO)  50/100      Observation/Other Assessments-Edema    Edema Circumferential      Circumferential Edema   Circumferential - Right 51.7cm   midpatella in TKE   Circumferential - Left  48cm   midpatella in TKE     ROM / Strength   AROM / PROM / Strength PROM      PROM   PROM Assessment Site Knee    Right/Left Knee Right    Right Knee Extension 20    Right Knee Flexion 82   seated      Transfers   Five time sit to stand comments  deferred until next visit             Objective measurements completed on  examination: See above findings.       [facility] Adult PT Treatment/Exercise - 07/10/19 0001      Exercises   Exercises Knee/Hip      Knee/Hip Exercises: Stretches   Other Knee/Hip Stretches Long sitting Rt ankle prop c LLLD 5lb load at patella   3 minutes (issued 3x30min at home, BID)      Knee/Hip Exercises: Seated   Long Arc Quad AROM;1 set;Right;10 reps   5secH      Knee/Hip Exercises: Supine   Quad Sets Right;1 set;10 reps;AROM;Strengthening   towel role under popliteal fossa   Quad Sets Limitations visible quads contraction and skin folding at patella    Heel Slides 10 reps;AROM;Right;Other (comment)   5secH in flexion and extension for mobility   Straight Leg Raises AROM;Right;Strengthening;1 set;5 sets   good control of TKE; heavy effort, slow movement                    PT Education - 07/10/19 1732    Education Details Need to improve TKE ROM for gait symmetry; use of shoe would allow for decreased ankle DF loading in gait and reduced ankle pain    Person(s) Educated Patient    Methods Explanation;Demonstration;Verbal cues    Comprehension Verbalized understanding;Need further instruction;Verbal cues required            PT Short Term Goals - 07/10/19 1741      PT SHORT TERM GOAL #1   Title After 2 weeks pt to demonstrate improved Rt knee extension P/ROM to ~12 degrees +/- 2 degrees.    Baseline lacks 20 degrees from neutral    Time 2    Period Weeks    Status New    Target Date 07/24/19      PT SHORT TERM GOAL #2   Title After 4 weeks pt to demonstrate improved Rt knee ROM 10-105 degrees flexion.    Baseline At eval pt has 20-82 degrees in Rt knee    Time 4    Period Weeks    Status New    Target Date 08/07/19             PT Long Term Goals - 07/10/19 1743      PT LONG TERM GOAL #1  Title After 8 weeks pt to demonstrate 5xSTS from standard chair hands-free in <15 seconds, symmetrical foot placement.    Baseline Untested at eval, requires  hands fro transfer    Time 8    Period Weeks    Status New    Target Date 09/04/19      PT LONG TERM GOAL #2   Title After 8 weeks pt to demonstrate improve quality and tolerance of AMB AEB 10MWT <10sec, and 6MWT>1447ft with AD ad lib.    Baseline RW for AMB <0.18m/s    Time 8    Period Weeks    Status New    Target Date 09/04/19      PT LONG TERM GOAL #3   Title After 8 weeks pt to demonstrate improved ability to participate in ADL/IADL AEB FOTO score >70.    Baseline At eval FOTO: 50    Time 8    Period Weeks    Status New    Target Date 09/04/19                  Plan - 07/10/19 1735    Clinical Impression Statement Pt presenting for evaluation s/p Rt TKA. Pt noted to be sturggling with ROM, only able to achieve 20-82 degrees Rt knee flexion this date. Pt also presenting with RLE edema from distal thigh to foot, as well as hemosiderin staining about the lateral, medial and posterior ankle. Reviewed HEP updated with patient with strong emphasis on improving TKE ROM. Improved night-time positioning of the limb for improved TKE stretch is also addressed. Pt appears to have slow but adequate control of quads activation, but weakness is perpetuated by joint hypomobility and effusion. Pt c/o ankle pain as well, which is likely being exacerbated by pedal edema and excessive loading into DF from combination of knee contracture and habitual zero drop foot wear. Pt educated on ability of athletic shoes to help with ankle issue, although she may need to find a shoe that can accommodate her edematous foot.    Personal Factors and Comorbidities Age;Comorbidity 1;Time since onset of injury/illness/exacerbation    Comorbidities DM    Examination-Activity Limitations Bathing;Locomotion Level;Transfers;Bed Mobility;Dressing;Stand;Stairs;Squat;Sit    Examination-Participation Restrictions Cleaning;Community Activity;Meal Prep;Yard Work;Shop;Driving    Stability/Clinical Decision Making  Evolving/Moderate complexity    Clinical Decision Making Moderate    Rehab Potential Good    PT Frequency 2x / week    PT Duration 8 weeks    PT Treatment/Interventions ADLs/Self Care Home Management;Electrical Stimulation;Cryotherapy;Gait training;Stair training;Functional mobility training;Therapeutic activities;Therapeutic exercise;Balance training;Neuromuscular re-education;Patient/family education;Cognitive remediation;Scar mobilization;Passive range of motion;Dry needling    PT Next Visit Plan Education on scar massage, review HEP, work on extension ROM    PT Home Exercise Plan Quad sets, LAQ, LLLD TKE stretch, heel slides, SLR    Consulted and Agree with Plan of Care Patient           Patient will benefit from skilled therapeutic intervention in order to improve the following deficits and impairments:  Abnormal gait, Decreased activity tolerance, Decreased balance, Decreased knowledge of precautions, Decreased knowledge of use of DME, Decreased mobility, Decreased range of motion, Decreased skin integrity, Decreased scar mobility, Decreased strength, Increased edema, Difficulty walking, Increased fascial restricitons, Increased muscle spasms, Postural dysfunction  Visit Diagnosis: Stiffness of right knee, not elsewhere classified  Difficulty in walking, not elsewhere classified     Problem List There are no problems to display for this patient.  5:53 PM, 07/10/19 Etta Grandchild, PT, DPT  Physical Therapist - [facility] [phone]     Rosamaria Lints 07/10/2019, 5:48 PM  [facility] MAIN [facility] SERVICES [address] [place], [place], [address] Phone: [phone]   Fax:  [phone]  Name: Jinelle Butchko MRN: [id number] Date of Birth: [birth year]

## 2019-07-12 ENCOUNTER — Ambulatory Visit: Payer: Medicare Other

## 2019-07-13 ENCOUNTER — Ambulatory Visit: Payer: Medicare Other

## 2019-07-13 ENCOUNTER — Other Ambulatory Visit: Payer: Self-pay

## 2019-07-13 DIAGNOSIS — R262 Difficulty in walking, not elsewhere classified: Secondary | ICD-10-CM

## 2019-07-13 DIAGNOSIS — M25661 Stiffness of right knee, not elsewhere classified: Secondary | ICD-10-CM

## 2019-07-13 NOTE — Therapy (Signed)
[facility] MAIN [facility] SERVICES [address] [place], [place], [address] Phone: [phone]   Fax:  [phone]  Physical Therapy Treatment  Patient Details  Name: Virginia Hunt MRN: [id number] Date of Birth: [birth year] Referring Provider (PT): Guerry Bruin MD (orthopedist)   Encounter Date: 07/13/2019   PT End of Session - 07/13/19 1136    Visit Number 2    Number of Visits 16    Date for PT Re-Evaluation 09/04/19    Authorization Type MCR    Authorization Time Period 07/10/19-09/04/19    PT Start Time 1046    PT Stop Time 1130    PT Time Calculation (min) 44 min    Activity Tolerance Patient tolerated treatment well;Patient limited by pain    Behavior During Therapy [facility] for tasks assessed/performed           Past Medical History:  Diagnosis Date  . Arthritis   . Carpal tunnel syndrome   . Diabetes mellitus without complication (HCC)    pre diabetes  . Diverticulitis   . Fibromyalgia   . Hypertension     Past Surgical History:  Procedure Laterality Date  . ABDOMINAL HYSTERECTOMY    . CHOLECYSTECTOMY    . TONSILLECTOMY      There were no vitals filed for this visit.   Subjective Assessment - 07/13/19 1135    Subjective Patient reports compliance with HEP. Went to physician about swelling in post op leg, had ultrasound completed.    Pertinent History Pt reports Rt TKA on Jun 04, 2019, since has been working wiht HHPT. Pt reports prior to surgery had 'years' of knee pain with cortisone injection therapy for 10-12 years. Pt reports significant limitations in basic mobility. Pt reports having had PT in the past for insidious gait instability, shuffling, LOB. Pt reports this has since resolved.    How long can you sit comfortably? ~30 minutes    How long can you stand comfortably? ~60 minutes    How long can you walk comfortably? ~10-15 minutes (walking outside in parking lot since surgery)    Currently in Pain? Yes    Pain  Score 3     Pain Location Knee    Pain Orientation Right    Pain Descriptors / Indicators Aching    Pain Type Acute pain;Surgical pain    Pain Onset More than a month ago    Pain Frequency Intermittent    Aggravating Factors  movement    Pain Relieving Factors rest               Treatment:  Manual  -contract relax against PT resistance in supine with RLE on PT shoulder for flexion/extension ; increasing flexion and extension each set 15x 10 second holds -hamstring stretch with distraction RLE with RLE on PT shoulder for inferior distraction 60 seconds -patella mobilizations: inferior, superior, medial, lateral 10x each direction 5 second gentle grade I glides -scar tissue massage (added to HEP). X 4 minutes  -contract relax against PT resistance seated with increasing knee flexion with repetition 12x 10 second holds    Therex: cueing for sequencing, body mechanics, and posture for optimal muscle recruitment: -supine quad set 10x RLE with tactile cueing for muscle recruitment -supine SLR 5x, very challenging RLE with focus on quad squeeze prior to leg lift -supine heel slides 15x RLE with increasing range each attempt -seated LAQ with focus on neutral knee alignment 10x -seated march RLE to PT hand with focus on neutral  knee alignment and reduction of external rotation of hip 10x -seated weight shift L and R for reduction of tilt to the non affected side -standing weight shift "booty bumps" (added to HEP) for reduction of antalgic gait pattern x 12    flexion AROM at end of session: 92 degrees   Pt educated throughout session about proper posture and technique with exercises. Improved exercise technique, movement at target joints, use of target muscles after min to mod verbal, visual, tactile cues.    Patient presents to physical therapy with excellent motivation. Her functional range of motion of surgical limb is improving with prolonged holds. Neutral alignment is difficult  for patient to obtain with preference for external rotation and weight shift to non affected side. Patient will benefit from skilled physical therapy to reduce pain, improve mobility, and return to PLOF.                   PT Education - 07/13/19 1136    Education Details scar tissue massage, weight acceptance    Person(s) Educated Patient    Methods Explanation;Tactile cues;Demonstration;Verbal cues    Comprehension Verbalized understanding;Returned demonstration;Verbal cues required;Tactile cues required            PT Short Term Goals - 07/10/19 1741      PT SHORT TERM GOAL #1   Title After 2 weeks pt to demonstrate improved Rt knee extension P/ROM to ~12 degrees +/- 2 degrees.    Baseline lacks 20 degrees from neutral    Time 2    Period Weeks    Status New    Target Date 07/24/19      PT SHORT TERM GOAL #2   Title After 4 weeks pt to demonstrate improved Rt knee ROM 10-105 degrees flexion.    Baseline At eval pt has 20-82 degrees in Rt knee    Time 4    Period Weeks    Status New    Target Date 08/07/19             PT Long Term Goals - 07/10/19 1743      PT LONG TERM GOAL #1   Title After 8 weeks pt to demonstrate 5xSTS from standard chair hands-free in <15 seconds, symmetrical foot placement.    Baseline Untested at eval, requires hands fro transfer    Time 8    Period Weeks    Status New    Target Date 09/04/19      PT LONG TERM GOAL #2   Title After 8 weeks pt to demonstrate improve quality and tolerance of AMB AEB 10MWT <10sec, and 6MWT>1414ft with AD ad lib.    Baseline RW for AMB <0.88m/s    Time 8    Period Weeks    Status New    Target Date 09/04/19      PT LONG TERM GOAL #3   Title After 8 weeks pt to demonstrate improved ability to participate in ADL/IADL AEB FOTO score >70.    Baseline At eval FOTO: 50    Time 8    Period Weeks    Status New    Target Date 09/04/19                 Plan - 07/13/19 1143    Clinical  Impression Statement Patient presents to physical therapy with excellent motivation. Her functional range of motion of surgical limb is improving with prolonged holds. Neutral alignment is difficult for patient to obtain with preference  for external rotation and weight shift to non affected side. Patient will benefit from skilled physical therapy to reduce pain, improve mobility, and return to PLOF.    Personal Factors and Comorbidities Age;Comorbidity 1;Time since onset of injury/illness/exacerbation    Comorbidities DM    Examination-Activity Limitations Bathing;Locomotion Level;Transfers;Bed Mobility;Dressing;Stand;Stairs;Squat;Sit    Examination-Participation Restrictions Cleaning;Community Activity;Meal Prep;Yard Work;Shop;Driving    Stability/Clinical Decision Making Evolving/Moderate complexity    Rehab Potential Good    PT Frequency 2x / week    PT Duration 8 weeks    PT Treatment/Interventions ADLs/Self Care Home Management;Electrical Stimulation;Cryotherapy;Gait training;Stair training;Functional mobility training;Therapeutic activities;Therapeutic exercise;Balance training;Neuromuscular re-education;Patient/family education;Cognitive remediation;Scar mobilization;Passive range of motion;Dry needling    PT Next Visit Plan Education on scar massage, review HEP, work on extension ROM    PT Home Exercise Plan Quad sets, LAQ, LLLD TKE stretch, heel slides, SLR    Consulted and Agree with Plan of Care Patient           Patient will benefit from skilled therapeutic intervention in order to improve the following deficits and impairments:  Abnormal gait, Decreased activity tolerance, Decreased balance, Decreased knowledge of precautions, Decreased knowledge of use of DME, Decreased mobility, Decreased range of motion, Decreased skin integrity, Decreased scar mobility, Decreased strength, Increased edema, Difficulty walking, Increased fascial restricitons, Increased muscle spasms, Postural  dysfunction  Visit Diagnosis: Stiffness of right knee, not elsewhere classified  Difficulty in walking, not elsewhere classified     Problem List There are no problems to display for this patient.  Precious Bard, PT, DPT   07/13/2019, 11:44 AM  [facility] MAIN [facility] SERVICES [address] [place], [place], [address] Phone: [phone]   Fax:  [phone]  Name: Sherissa Tenenbaum MRN: [id number] Date of Birth: 1945/10/11

## 2019-07-17 ENCOUNTER — Other Ambulatory Visit: Payer: Self-pay

## 2019-07-17 ENCOUNTER — Ambulatory Visit: Payer: Medicare Other

## 2019-07-17 DIAGNOSIS — M25661 Stiffness of right knee, not elsewhere classified: Secondary | ICD-10-CM

## 2019-07-17 DIAGNOSIS — R262 Difficulty in walking, not elsewhere classified: Secondary | ICD-10-CM

## 2019-07-17 NOTE — Therapy (Signed)
[facility] MAIN [facility] SERVICES [address] [place], [place], [address] Phone: [phone]   Fax:  [phone]  Physical Therapy Treatment  Patient Details  Name: Virginia Hunt MRN: [id number] Date of Birth: 06/14/1945 Referring Provider (PT): Guerry Bruin MD (orthopedist)   Encounter Date: 07/17/2019   PT End of Session - 07/17/19 1701    Visit Number 3    Number of Visits 16    Date for PT Re-Evaluation 09/04/19    Authorization Type MCR    Authorization Time Period 07/10/19-09/04/19    PT Start Time 1624    PT Stop Time 1653    PT Time Calculation (min) 29 min    Activity Tolerance Patient tolerated treatment well;Patient limited by pain    Behavior During Therapy [facility] for tasks assessed/performed           Past Medical History:  Diagnosis Date  . Arthritis   . Carpal tunnel syndrome   . Diabetes mellitus without complication (HCC)    pre diabetes  . Diverticulitis   . Fibromyalgia   . Hypertension     Past Surgical History:  Procedure Laterality Date  . ABDOMINAL HYSTERECTOMY    . CHOLECYSTECTOMY    . TONSILLECTOMY      There were no vitals filed for this visit.   Subjective Assessment - 07/17/19 1659    Subjective Patient is reliant upon other people for rides and was 30 minutes late to session. Reports she heard from the doctor and does not have a DVT. Had a flare up of RA over the weekend.    Pertinent History Pt reports Rt TKA on Jun 04, 2019, since has been working wiht HHPT. Pt reports prior to surgery had 'years' of knee pain with cortisone injection therapy for 10-12 years. Pt reports significant limitations in basic mobility. Pt reports having had PT in the past for insidious gait instability, shuffling, LOB. Pt reports this has since resolved.    How long can you sit comfortably? ~30 minutes    How long can you stand comfortably? ~60 minutes    How long can you walk comfortably? ~10-15 minutes (walking outside  in parking lot since surgery)    Currently in Pain? Yes    Pain Score 4     Pain Location Knee    Pain Orientation Right    Pain Descriptors / Indicators Aching;Throbbing    Pain Type Acute pain;Surgical pain    Pain Onset More than a month ago    Pain Frequency Intermittent                  Treatment:   Manual  -contract relax against PT resistance in supine with RLE on PT shoulder for flexion/extension ; increasing flexion and extension each set 15x 10 second holds -hamstring stretch with distraction RLE with RLE on PT shoulder for inferior distraction 60 seconds;  -Sciatic nerve glides with ankle pumps 20x on PT shoulder for optimal lengthening and swelling reduction -patella mobilizations: inferior, superior, medial, lateral 10x each direction 5 second gentle grade I glides -prone hip flexor/knee flexion stretch with overpressure for optimal lengthening and angle 4x 30 second holds    Therex: cueing for sequencing, body mechanics, and posture for optimal muscle recruitment:  -seated ABC spelling with focus on coordination, muscle activation, and spatial awareness   -supine heel slide with PT overpressure at optimal knee flexion and extension 10x 8 second hold at full flexion and full extension -Prone hamstring  curl with 3 second hold at top of curl 15x      Patient arrived late to PT session limiting interventions. Was given handout for [employer] transportation due to limited availability/[employer] for rides. Introduced prone positioning this session with patient tolerating it well. Patient will benefit from skilled physical therapy to reduce pain, improve mobility, and return to PLOF            PT Education - 07/17/19 1700    Education Details exercise technique, body mechanics    Person(s) Educated Patient    Methods Explanation;Demonstration;Tactile cues;Verbal cues    Comprehension Verbalized understanding;Returned demonstration;Verbal cues required;Tactile cues  required            PT Short Term Goals - 07/10/19 1741      PT SHORT TERM GOAL #1   Title After 2 weeks pt to demonstrate improved Rt knee extension P/ROM to ~12 degrees +/- 2 degrees.    Baseline lacks 20 degrees from neutral    Time 2    Period Weeks    Status New    Target Date 07/24/19      PT SHORT TERM GOAL #2   Title After 4 weeks pt to demonstrate improved Rt knee ROM 10-105 degrees flexion.    Baseline At eval pt has 20-82 degrees in Rt knee    Time 4    Period Weeks    Status New    Target Date 08/07/19             PT Long Term Goals - 07/10/19 1743      PT LONG TERM GOAL #1   Title After 8 weeks pt to demonstrate 5xSTS from standard chair hands-free in <15 seconds, symmetrical foot placement.    Baseline Untested at eval, requires hands fro transfer    Time 8    Period Weeks    Status New    Target Date 09/04/19      PT LONG TERM GOAL #2   Title After 8 weeks pt to demonstrate improve quality and tolerance of AMB AEB <10sec, and 6MWT>1424ft with AD ad lib.    Baseline RW for AMB <0.42m/s    Time 8    Period Weeks    Status New    Target Date 09/04/19      PT LONG TERM GOAL #3   Title After 8 weeks pt to demonstrate improved ability to participate in ADL/IADL AEB FOTO score >70.    Baseline At eval FOTO: 50    Time 8    Period Weeks    Status New    Target Date 09/04/19                 Plan - 07/17/19 1704    Clinical Impression Statement Patient arrived late to PT session limiting interventions. Was given handout for [employer] transportation due to limited availability/[employer] for rides. Introduced prone positioning this session with patient tolerating it well. Patient will benefit from skilled physical therapy to reduce pain, improve mobility, and return to PLOF    Personal Factors and Comorbidities Age;Comorbidity 1;Time since onset of injury/illness/exacerbation    Comorbidities DM    Examination-Activity Limitations  Bathing;Locomotion Level;Transfers;Bed Mobility;Dressing;Stand;Stairs;Squat;Sit    Examination-Participation Restrictions Cleaning;Community Activity;Meal Prep;Yard Work;Shop;Driving    Stability/Clinical Decision Making Evolving/Moderate complexity    Rehab Potential Good    PT Frequency 2x / week    PT Duration 8 weeks    PT Treatment/Interventions ADLs/Self Care Home Management;Electrical Stimulation;Cryotherapy;Gait training;Stair training;Functional  mobility training;Therapeutic activities;Therapeutic exercise;Balance training;Neuromuscular re-education;Patient/family education;Cognitive remediation;Scar mobilization;Passive range of motion;Dry needling    PT Next Visit Plan Education on scar massage, review HEP, work on extension ROM    PT Home Exercise Plan Quad sets, LAQ, LLLD TKE stretch, heel slides, SLR    Consulted and Agree with Plan of Care Patient           Patient will benefit from skilled therapeutic intervention in order to improve the following deficits and impairments:  Abnormal gait, Decreased activity tolerance, Decreased balance, Decreased knowledge of precautions, Decreased knowledge of use of DME, Decreased mobility, Decreased range of motion, Decreased skin integrity, Decreased scar mobility, Decreased strength, Increased edema, Difficulty walking, Increased fascial restricitons, Increased muscle spasms, Postural dysfunction  Visit Diagnosis: Stiffness of right knee, not elsewhere classified  Difficulty in walking, not elsewhere classified     Problem List There are no problems to display for this patient.  Precious Bard, PT, DPT   07/17/2019, 5:05 PM  [facility] MAIN [facility] SERVICES [address] [place], [place], [address] Phone: [phone]   Fax:  [phone]  Name: Aneeka Bowden MRN: [id number] Date of Birth: 06-14-1945

## 2019-07-19 ENCOUNTER — Ambulatory Visit: Payer: Medicare HMO

## 2019-07-24 ENCOUNTER — Other Ambulatory Visit: Payer: Self-pay

## 2019-07-24 ENCOUNTER — Ambulatory Visit: Payer: Medicare HMO | Attending: Orthopedic Surgery

## 2019-07-24 DIAGNOSIS — M25661 Stiffness of right knee, not elsewhere classified: Secondary | ICD-10-CM | POA: Diagnosis not present

## 2019-07-24 DIAGNOSIS — R262 Difficulty in walking, not elsewhere classified: Secondary | ICD-10-CM

## 2019-07-24 NOTE — Therapy (Signed)
[facility] MAIN [facility] SERVICES [address] [place], [place], [address] Phone: [phone]   Fax:  [phone]  Physical Therapy Treatment  Patient Details  Name: Virginia Hunt MRN: [id number] Date of Birth: 07/03/1945 Referring Provider (PT): Guerry Bruin MD (orthopedist)   Encounter Date: 07/24/2019   PT End of Session - 07/24/19 1717    Visit Number 4    Number of Visits 16    Date for PT Re-Evaluation 09/04/19    Authorization Type MCR    Authorization Time Period 07/10/19-09/04/19    PT Start Time 1529    PT Stop Time 1600    PT Time Calculation (min) 31 min    Activity Tolerance Patient tolerated treatment well;Patient limited by pain    Behavior During Therapy [facility] for tasks assessed/performed           Past Medical History:  Diagnosis Date  . Arthritis   . Carpal tunnel syndrome   . Diabetes mellitus without complication (HCC)    pre diabetes  . Diverticulitis   . Fibromyalgia   . Hypertension     Past Surgical History:  Procedure Laterality Date  . ABDOMINAL HYSTERECTOMY    . CHOLECYSTECTOMY    . TONSILLECTOMY      There were no vitals filed for this visit.   Subjective Assessment - 07/24/19 1715    Subjective Patient is late for session again. Missed an entire week of treatments prior to this appointment for MRI and other medical problems per patient report.    Pertinent History Pt reports Rt TKA on Jun 04, 2019, since has been working wiht HHPT. Pt reports prior to surgery had 'years' of knee pain with cortisone injection therapy for 10-12 years. Pt reports significant limitations in basic mobility. Pt reports having had PT in the past for insidious gait instability, shuffling, LOB. Pt reports this has since resolved.    How long can you sit comfortably? ~30 minutes    How long can you stand comfortably? ~60 minutes    How long can you walk comfortably? ~10-15 minutes (walking outside in parking lot since surgery)     Currently in Pain? Yes    Pain Score 4     Pain Location Knee    Pain Orientation Right    Pain Descriptors / Indicators Aching;Throbbing    Pain Type Acute pain;Surgical pain    Pain Onset More than a month ago    Pain Frequency Intermittent    Aggravating Factors  weight acceptance knee flexion extension    Pain Relieving Factors rest             Patient arrived late to PT session limiting session duration. Patient additionally has cancelled/no shown/ and/or showed up >30 minutes late for sessions resulting in poor compliance and poor results.     Treatment:   Manual  -contract relax against PT resistance in supine with RLE on PT shoulder for flexion/extension ; increasing flexion and extension each set 15x 10 second holds -hamstring stretch with distraction RLE with RLE on PT shoulder for inferior distraction 60 seconds;  -patella mobilizations: inferior, superior, medial, lateral 10x each direction 5 second gentle grade I glides -prone hip flexor/knee flexion stretch with overpressure for optimal lengthening and angle 6x 30 second holds  -seated contract relax flexion/extension with increasing pressure into flexion 5x 3 second holds x 2 trials   Therex: cueing for sequencing, body mechanics, and posture for optimal muscle recruitment:  -supine heel slide  with PT overpressure at optimal knee flexion and extension 10x 8 second hold at full flexion and full extension -Prone hamstring curl with 3 second hold at top of curl 15x   -stair knee flexion stretch with BUE support 3x 30 second holds (added to HEP) -weight acceptance on surgical limb (non affected LE on soccer ball) finger tip support on RW 2x 30 second holds -hip extension with slight knee flexion of front stabilizing LE, 10x each LE      Patient arrived late to PT session limiting session duration. Patient additionally has cancelled/no shown/ and/or showed up >30 minutes late for sessions resulting in poor  compliance and poor results. Educated on need for compliance/showing up for appointments for progression of care.  Patient will benefit from skilled physical therapy to reduce pain, improve mobility, and return to [facility]                        PT Education - 07/24/19 1716    Education Details need for compliance/attending PT sessions    Person(s) Educated Patient    Methods Explanation    Comprehension Verbalized understanding            PT Short Term Goals - 07/10/19 1741      PT SHORT TERM GOAL #1   Title After 2 weeks pt to demonstrate improved Rt knee extension P/ROM to ~12 degrees +/- 2 degrees.    Baseline lacks 20 degrees from neutral    Time 2    Period Weeks    Status New    Target Date 07/24/19      PT SHORT TERM GOAL #2   Title After 4 weeks pt to demonstrate improved Rt knee ROM 10-105 degrees flexion.    Baseline At eval pt has 20-82 degrees in Rt knee    Time 4    Period Weeks    Status New    Target Date 08/07/19             PT Long Term Goals - 07/10/19 1743      PT LONG TERM GOAL #1   Title After 8 weeks pt to demonstrate 5xSTS from standard chair hands-free in <15 seconds, symmetrical foot placement.    Baseline Untested at eval, requires hands fro transfer    Time 8    Period Weeks    Status New    Target Date 09/04/19      PT LONG TERM GOAL #2   Title After 8 weeks pt to demonstrate improve quality and tolerance of AMB AEB <10sec, and 6MWT>1427ft with AD ad lib.    Baseline RW for AMB <0.33m/s    Time 8    Period Weeks    Status New    Target Date 09/04/19      PT LONG TERM GOAL #3   Title After 8 weeks pt to demonstrate improved ability to participate in ADL/IADL AEB FOTO score >70.    Baseline At eval FOTO: 50    Time 8    Period Weeks    Status New    Target Date 09/04/19                 Plan - 07/24/19 1717    Clinical Impression Statement Patient arrived late to PT session limiting session  duration. Patient additionally has cancelled/no shown/ and/or showed up >30 minutes late for sessions resulting in poor compliance and poor results. Educated on need for compliance/showing  up for appointments for progression of care.  Patient will benefit from skilled physical therapy to reduce pain, improve mobility, and return to PLOF    Personal Factors and Comorbidities Age;Comorbidity 1;Time since onset of injury/illness/exacerbation    Comorbidities DM    Examination-Activity Limitations Bathing;Locomotion Level;Transfers;Bed Mobility;Dressing;Stand;Stairs;Squat;Sit    Examination-Participation Restrictions Cleaning;Community Activity;Meal Prep;Yard Work;Shop;Driving    Stability/Clinical Decision Making Evolving/Moderate complexity    Rehab Potential Good    PT Frequency 2x / week    PT Duration 8 weeks    PT Treatment/Interventions ADLs/Self Care Home Management;Electrical Stimulation;Cryotherapy;Gait training;Stair training;Functional mobility training;Therapeutic activities;Therapeutic exercise;Balance training;Neuromuscular re-education;Patient/family education;Cognitive remediation;Scar mobilization;Passive range of motion;Dry needling    PT Next Visit Plan Education on scar massage, review HEP, work on extension ROM    PT Home Exercise Plan Quad sets, LAQ, LLLD TKE stretch, heel slides, SLR    Consulted and Agree with Plan of Care Patient           Patient will benefit from skilled therapeutic intervention in order to improve the following deficits and impairments:  Abnormal gait, Decreased activity tolerance, Decreased balance, Decreased knowledge of precautions, Decreased knowledge of use of DME, Decreased mobility, Decreased range of motion, Decreased skin integrity, Decreased scar mobility, Decreased strength, Increased edema, Difficulty walking, Increased fascial restricitons, Increased muscle spasms, Postural dysfunction  Visit Diagnosis: Stiffness of right knee, not elsewhere  classified  Difficulty in walking, not elsewhere classified     Problem List There are no problems to display for this patient.  Precious Bard, PT, DPT   07/24/2019, 5:18 PM  [facility] MAIN [facility] SERVICES [address] [place], [place], [address] Phone: [phone]   Fax:  [phone]  Name: Zhane Donlan MRN: [id number] Date of Birth: Jul 08, 1945

## 2019-07-26 ENCOUNTER — Ambulatory Visit: Payer: Medicare HMO

## 2019-07-26 ENCOUNTER — Other Ambulatory Visit: Payer: Self-pay

## 2019-07-26 DIAGNOSIS — M25661 Stiffness of right knee, not elsewhere classified: Secondary | ICD-10-CM | POA: Diagnosis not present

## 2019-07-26 DIAGNOSIS — R262 Difficulty in walking, not elsewhere classified: Secondary | ICD-10-CM

## 2019-07-27 NOTE — Therapy (Signed)
[facility] MAIN [facility] SERVICES [address] [place], [place], [address] Phone: [phone]   Fax:  [phone]  Physical Therapy Treatment  Patient Details  Name: Virginia Hunt MRN: [id number] Date of Birth: [birth year] Referring Provider (PT): Guerry Bruin MD (orthopedist)   Encounter Date: 07/26/2019   PT End of Session - 07/27/19 1103    Visit Number 5    Number of Visits 16    Date for PT Re-Evaluation 09/04/19    Authorization Type MCR    Authorization Time Period 07/10/19-09/04/19    PT Start Time 1646    PT Stop Time 1732    PT Time Calculation (min) 46 min    Activity Tolerance Patient tolerated treatment well;Patient limited by pain    Behavior During Therapy [facility] for tasks assessed/performed           Past Medical History:  Diagnosis Date   Arthritis    Carpal tunnel syndrome    Diabetes mellitus without complication (HCC)    pre diabetes   Diverticulitis    Fibromyalgia    Hypertension     Past Surgical History:  Procedure Laterality Date   ABDOMINAL HYSTERECTOMY     CHOLECYSTECTOMY     TONSILLECTOMY      There were no vitals filed for this visit.   Subjective Assessment - 07/27/19 1102    Subjective Patient drove herself to therapy today. Saw surgeon yesterday who says she does not need a manipulation but will need more flexion.    Pertinent History Pt reports Rt TKA on Jun 04, 2019, since has been working wiht HHPT. Pt reports prior to surgery had 'years' of knee pain with cortisone injection therapy for 10-12 years. Pt reports significant limitations in basic mobility. Pt reports having had PT in the past for insidious gait instability, shuffling, LOB. Pt reports this has since resolved.    How long can you sit comfortably? ~30 minutes    How long can you stand comfortably? ~60 minutes    How long can you walk comfortably? ~10-15 minutes (walking outside in parking lot since surgery)    Currently in  Pain? Yes    Pain Score 4     Pain Location Knee    Pain Orientation Right    Pain Descriptors / Indicators Aching;Throbbing    Pain Type Acute pain;Surgical pain    Pain Onset More than a month ago    Pain Frequency Intermittent                      Treatment:   Manual  -contract relax against PT resistance in supine with RLE on PT shoulder for flexion/extension ; increasing flexion and extension each set 15x 10 second holds -hamstring stretch with distraction RLE with RLE on PT shoulder for inferior distraction 60 seconds;  -patella mobilizations: inferior, superior, medial, lateral 10x each direction 5 second gentle grade I glides -prone hip flexor/knee flexion stretch with overpressure for optimal lengthening and angle 6x 30 second holds  -seated contract relax flexion/extension with increasing pressure into flexion 5x 3 second holds x 2 trials  -scar tissue massage x 3 minutes Supine with ice on knee: LE rotation for low back pain reduction x 20 each side, hamstring lengthening 60 seconds, IT band lengthening 60 seconds   Therex: cueing for sequencing, body mechanics, and posture for optimal muscle recruitment:    -supine heel slide with PT overpressure at optimal knee flexion and extension 10x 8  second hold at full flexion and full extension -Prone hamstring curl with 3 second hold at top of curl 15x   -6" step:  -step up with BUE progressed to SUE support with alternating pattern of leg progression 12x each LE  -toe taps w/o UE support for progression of stabilization on limb. -TRX squat with chair behind; focus on equal weight shift x 12 -weight acceptance onto post surgical limb with SLE stance with BUE support 30 seconds x 2 trials       seated AROM 93 degrees.    Pt educated throughout session about proper posture and technique with exercises. Improved exercise technique, movement at target joints, use of target muscles after min to mod verbal, visual,  tactile cues                 PT Education - 07/27/19 1102    Education Details exercise technique, manual    Person(s) Educated Patient    Methods Explanation;Demonstration;Tactile cues;Verbal cues    Comprehension Verbalized understanding;Returned demonstration;Verbal cues required;Tactile cues required            PT Short Term Goals - 07/10/19 1741      PT SHORT TERM GOAL #1   Title After 2 weeks pt to demonstrate improved Rt knee extension P/ROM to ~12 degrees +/- 2 degrees.    Baseline lacks 20 degrees from neutral    Time 2    Period Weeks    Status New    Target Date 07/24/19      PT SHORT TERM GOAL #2   Title After 4 weeks pt to demonstrate improved Rt knee ROM 10-105 degrees flexion.    Baseline At eval pt has 20-82 degrees in Rt knee    Time 4    Period Weeks    Status New    Target Date 08/07/19             PT Long Term Goals - 07/10/19 1743      PT LONG TERM GOAL #1   Title After 8 weeks pt to demonstrate 5xSTS from standard chair hands-free in <15 seconds, symmetrical foot placement.    Baseline Untested at eval, requires hands fro transfer    Time 8    Period Weeks    Status New    Target Date 09/04/19      PT LONG TERM GOAL #2   Title After 8 weeks pt to demonstrate improve quality and tolerance of AMB AEB <10sec, and 6MWT>1475ft with AD ad lib.    Baseline RW for AMB <0.58m/s    Time 8    Period Weeks    Status New    Target Date 09/04/19      PT LONG TERM GOAL #3   Title After 8 weeks pt to demonstrate improved ability to participate in ADL/IADL AEB FOTO score >70.    Baseline At eval FOTO: 50    Time 8    Period Weeks    Status New    Target Date 09/04/19                 Plan - 07/27/19 1104    Clinical Impression Statement Patient arrived on time this session allowing for completion of whole session and progression of interventions to weight acceptance and functional mobility. Quad contraction of post  surgical limb is functional but limited in capacity for prolonged recruitment indicating area for continued improvement. Patient will benefit from skilled physical therapy to reduce pain, improve  mobility, and return to PLOF    Personal Factors and Comorbidities Age;Comorbidity 1;Time since onset of injury/illness/exacerbation    Comorbidities DM    Examination-Activity Limitations Bathing;Locomotion Level;Transfers;Bed Mobility;Dressing;Stand;Stairs;Squat;Sit    Examination-Participation Restrictions Cleaning;Community Activity;Meal Prep;Yard Work;Shop;Driving    Stability/Clinical Decision Making Evolving/Moderate complexity    Rehab Potential Good    PT Frequency 2x / week    PT Duration 8 weeks    PT Treatment/Interventions ADLs/Self Care Home Management;Electrical Stimulation;Cryotherapy;Gait training;Stair training;Functional mobility training;Therapeutic activities;Therapeutic exercise;Balance training;Neuromuscular re-education;Patient/family education;Cognitive remediation;Scar mobilization;Passive range of motion;Dry needling    PT Next Visit Plan Education on scar massage, review HEP, work on extension ROM    PT Home Exercise Plan Quad sets, LAQ, LLLD TKE stretch, heel slides, SLR    Consulted and Agree with Plan of Care Patient           Patient will benefit from skilled therapeutic intervention in order to improve the following deficits and impairments:  Abnormal gait, Decreased activity tolerance, Decreased balance, Decreased knowledge of precautions, Decreased knowledge of use of DME, Decreased mobility, Decreased range of motion, Decreased skin integrity, Decreased scar mobility, Decreased strength, Increased edema, Difficulty walking, Increased fascial restricitons, Increased muscle spasms, Postural dysfunction  Visit Diagnosis: Stiffness of right knee, not elsewhere classified  Difficulty in walking, not elsewhere classified     Problem List There are no problems to  display for this patient.  Precious Bard, PT, DPT   07/27/2019, 11:06 AM  [place] [facility] MAIN [facility] SERVICES [address] [place], [place], [address] Phone: [phone]   Fax:  [phone]  Name: Virginia Hunt MRN: [id number] Date of Birth: Sep 19, 1945

## 2019-07-31 ENCOUNTER — Ambulatory Visit: Payer: Medicare HMO

## 2019-07-31 ENCOUNTER — Other Ambulatory Visit: Payer: Self-pay

## 2019-07-31 DIAGNOSIS — M25661 Stiffness of right knee, not elsewhere classified: Secondary | ICD-10-CM

## 2019-07-31 DIAGNOSIS — R262 Difficulty in walking, not elsewhere classified: Secondary | ICD-10-CM

## 2019-07-31 NOTE — Therapy (Signed)
[facility] MAIN [facility] SERVICES [address] [place], [place], [address] Phone: [phone]   Fax:  [phone]  Physical Therapy Treatment  Patient Details  Name: Virginia Hunt MRN: [id number] Date of Birth: 1945-04-18 Referring Provider (PT): Guerry Bruin MD (orthopedist)   Encounter Date: 07/31/2019   PT End of Session - 07/31/19 1613    Visit Number 6    Number of Visits 16    Date for PT Re-Evaluation 09/04/19    Authorization Type MCR    Authorization Time Period 07/10/19-09/04/19    PT Start Time 1528    PT Stop Time 1602    PT Time Calculation (min) 34 min    Activity Tolerance Patient tolerated treatment well;Patient limited by pain    Behavior During Therapy [facility] for tasks assessed/performed           Past Medical History:  Diagnosis Date  . Arthritis   . Carpal tunnel syndrome   . Diabetes mellitus without complication (HCC)    pre diabetes  . Diverticulitis   . Fibromyalgia   . Hypertension     Past Surgical History:  Procedure Laterality Date  . ABDOMINAL HYSTERECTOMY    . CHOLECYSTECTOMY    . TONSILLECTOMY      There were no vitals filed for this visit.   Subjective Assessment - 07/31/19 1532    Subjective Patient arrived late to session due to difficulty with her driver. No falls or LOB since last session. Has her sister and her granddaughter visiting.    Pertinent History Pt reports Rt TKA on Jun 04, 2019, since has been working wiht HHPT. Pt reports prior to surgery had 'years' of knee pain with cortisone injection therapy for 10-12 years. Pt reports significant limitations in basic mobility. Pt reports having had PT in the past for insidious gait instability, shuffling, LOB. Pt reports this has since resolved.    How long can you sit comfortably? ~30 minutes    How long can you stand comfortably? ~60 minutes    How long can you walk comfortably? ~10-15 minutes (walking outside in parking lot since surgery)     Currently in Pain? Yes    Pain Score 4     Pain Location Knee    Pain Orientation Right    Pain Descriptors / Indicators Aching;Throbbing    Pain Type Acute pain    Pain Onset More than a month ago    Pain Frequency Intermittent               Treatment:   Manual   -prone hip flexor/knee flexion stretch with overpressure for optimal lengthening and angle 6x 30 second holds  -seated contract relax flexion/extension with increasing pressure into flexion 5x 3 second holds x 2 trials  -scar tissue massage x 3 minutes     Therex: cueing for sequencing, body mechanics, and posture for optimal muscle recruitment:  Nustep Lvl 1 seat 10 RPM > 63 4 minutes   -seated heel slide with PT overpressure at optimal knee flexion and extension 10x 8 second hold at full flexion and full extension  -Prone hamstring curl with 3 second hold at top of curl 15x   Speed ladder: one foot In each square, min A for stabilization and weight acceptance onto RLE  Ambulation with RW 60 ft with focus on weight acceptance and shift onto post surgical limb, progressed to without AD for additional 60 ft with CGA  Seated: 2.5 ankle weight: -LAQ 10x  with cue for neutral foot alignment -ER/IR over RTB on ground 15x -df/pf 15x       seated AROM 93 degrees.    Pt educated throughout session about proper posture and technique with exercises. Improved exercise technique, movement at target joints, use of target muscles after min to mod verbal, visual, tactile cues                         PT Education - 07/31/19 1613    Education Details exercise technique, manual, need for compliance    Person(s) Educated Patient    Methods Explanation;Tactile cues;Demonstration;Verbal cues    Comprehension Verbalized understanding;Returned demonstration;Verbal cues required;Tactile cues required            PT Short Term Goals - 07/10/19 1741      PT SHORT TERM GOAL #1   Title After 2 weeks pt to  demonstrate improved Rt knee extension P/ROM to ~12 degrees +/- 2 degrees.    Baseline lacks 20 degrees from neutral    Time 2    Period Weeks    Status New    Target Date 07/24/19      PT SHORT TERM GOAL #2   Title After 4 weeks pt to demonstrate improved Rt knee ROM 10-105 degrees flexion.    Baseline At eval pt has 20-82 degrees in Rt knee    Time 4    Period Weeks    Status New    Target Date 08/07/19             PT Long Term Goals - 07/10/19 1743      PT LONG TERM GOAL #1   Title After 8 weeks pt to demonstrate 5xSTS from standard chair hands-free in <15 seconds, symmetrical foot placement.    Baseline Untested at eval, requires hands fro transfer    Time 8    Period Weeks    Status New    Target Date 09/04/19      PT LONG TERM GOAL #2   Title After 8 weeks pt to demonstrate improve quality and tolerance of AMB AEB <10sec, and 6MWT>1444ft with AD ad lib.    Baseline RW for AMB <0.18m/s    Time 8    Period Weeks    Status New    Target Date 09/04/19      PT LONG TERM GOAL #3   Title After 8 weeks pt to demonstrate improved ability to participate in ADL/IADL AEB FOTO score >70.    Baseline At eval FOTO: 50    Time 8    Period Weeks    Status New    Target Date 09/04/19                 Plan - 07/31/19 1615    Clinical Impression Statement Patient session limited by late arrival reducing number of interventions able to be performed. Patient challenged with maintaining weight acceptance on post surgical limb. Strengthening interventions performed with good tolerance and progression. Patient will benefit from skilled physical therapy to reduce pain, improve mobility, and return to PLOF    Personal Factors and Comorbidities Age;Comorbidity 1;Time since onset of injury/illness/exacerbation    Comorbidities DM    Examination-Activity Limitations Bathing;Locomotion Level;Transfers;Bed Mobility;Dressing;Stand;Stairs;Squat;Sit    Examination-Participation  Restrictions Cleaning;Community Activity;Meal Prep;Yard Work;Shop;Driving    Stability/Clinical Decision Making Evolving/Moderate complexity    Rehab Potential Good    PT Frequency 2x / week    PT Duration 8  weeks    PT Treatment/Interventions ADLs/Self Care Home Management;Electrical Stimulation;Cryotherapy;Gait training;Stair training;Functional mobility training;Therapeutic activities;Therapeutic exercise;Balance training;Neuromuscular re-education;Patient/family education;Cognitive remediation;Scar mobilization;Passive range of motion;Dry needling    PT Next Visit Plan Education on scar massage, review HEP, work on extension ROM    PT Home Exercise Plan Quad sets, LAQ, LLLD TKE stretch, heel slides, SLR    Consulted and Agree with Plan of Care Patient           Patient will benefit from skilled therapeutic intervention in order to improve the following deficits and impairments:  Abnormal gait, Decreased activity tolerance, Decreased balance, Decreased knowledge of precautions, Decreased knowledge of use of DME, Decreased mobility, Decreased range of motion, Decreased skin integrity, Decreased scar mobility, Decreased strength, Increased edema, Difficulty walking, Increased fascial restricitons, Increased muscle spasms, Postural dysfunction  Visit Diagnosis: Stiffness of right knee, not elsewhere classified  Difficulty in walking, not elsewhere classified     Problem List There are no problems to display for this patient.  Precious Bard, PT, DPT   07/31/2019, 4:16 PM  [facility] MAIN [facility] SERVICES [address] [place], [place], [address] Phone: [phone]   Fax:  [phone]  Name: Geneve Kimpel MRN: [id number] Date of Birth: [birth year]

## 2019-08-02 ENCOUNTER — Ambulatory Visit: Payer: Medicare HMO

## 2019-08-07 ENCOUNTER — Ambulatory Visit: Payer: Medicare HMO

## 2019-08-07 ENCOUNTER — Other Ambulatory Visit: Payer: Self-pay

## 2019-08-07 DIAGNOSIS — M25661 Stiffness of right knee, not elsewhere classified: Secondary | ICD-10-CM | POA: Diagnosis not present

## 2019-08-07 DIAGNOSIS — R262 Difficulty in walking, not elsewhere classified: Secondary | ICD-10-CM

## 2019-08-07 NOTE — Therapy (Signed)
[facility] MAIN [facility] SERVICES [address] [place], [place], [address] Phone: [phone]   Fax:  [phone]  Physical Therapy Treatment  Patient Details  Name: Virginia Hunt MRN: [id number] Date of Birth: 27-Jan-1945 Referring Provider (PT): Guerry Bruin MD (orthopedist)   Encounter Date: 08/07/2019   PT End of Session - 08/08/19 1041    Visit Number 7    Number of Visits 16    Date for PT Re-Evaluation 09/04/19    Authorization Type MCR    Authorization Time Period 07/10/19-09/04/19    PT Start Time 1318    PT Stop Time 1345    PT Time Calculation (min) 27 min    Equipment Utilized During Treatment Gait belt    Activity Tolerance Patient tolerated treatment well;Patient limited by pain    Behavior During Therapy WFL for tasks assessed/performed           Past Medical History:  Diagnosis Date   Arthritis    Carpal tunnel syndrome    Diabetes mellitus without complication (HCC)    pre diabetes   Diverticulitis    Fibromyalgia    Hypertension     Past Surgical History:  Procedure Laterality Date   ABDOMINAL HYSTERECTOMY     CHOLECYSTECTOMY     TONSILLECTOMY      There were no vitals filed for this visit.   Subjective Assessment - 08/08/19 1040    Subjective Patient arrived very late to session. Reports she forgot her time.    Pertinent History Pt reports Rt TKA on Jun 04, 2019, since has been working wiht HHPT. Pt reports prior to surgery had 'years' of knee pain with cortisone injection therapy for 10-12 years. Pt reports significant limitations in basic mobility. Pt reports having had PT in the past for insidious gait instability, shuffling, LOB. Pt reports this has since resolved.    How long can you sit comfortably? ~30 minutes    How long can you stand comfortably? ~60 minutes    How long can you walk comfortably? ~10-15 minutes (walking outside in parking lot since surgery)    Currently in Pain? Yes     Pain Score 4     Pain Location Knee    Pain Orientation Right    Pain Descriptors / Indicators Aching    Pain Type Surgical pain    Pain Onset More than a month ago    Pain Frequency Intermittent           Treatment:   Manual    -prone hip flexor/knee flexion stretch with overpressure for optimal lengthening and angle 6x 30 second holds  -seated contract relax flexion/extension with increasing pressure into flexion 5x 3 second holds x 2 trials  -scar tissue massage x 3 minutes  -patella mobilizations: superior, inferior, medial, lateral grade II 10 seconds x 2 trials each direction -tib fib mobilization anterior/posterior with rotation component    Therex: cueing for sequencing, body mechanics, and posture for optimal muscle recruitment: -6" step: step up/down 10x  -weight shift with non surgical limb on dynadisc for weight acceptance onto surgical limb 2x 60 seconds      Ambulation with RW 60 ft with focus on weight acceptance and shift onto post surgical limb, progressed to without AD for additional 60 ft with CGA       Pt educated throughout session about proper posture and technique with exercises. Improved exercise technique, movement at target joints, use of target muscles after min to mod  verbal, visual, tactile cues                             PT Education - 08/08/19 1040    Education Details need for compliance    Person(s) Educated Patient    Methods Explanation    Comprehension Verbalized understanding            PT Short Term Goals - 07/10/19 1741      PT SHORT TERM GOAL #1   Title After 2 weeks pt to demonstrate improved Rt knee extension P/ROM to ~12 degrees +/- 2 degrees.    Baseline lacks 20 degrees from neutral    Time 2    Period Weeks    Status New    Target Date 07/24/19      PT SHORT TERM GOAL #2   Title After 4 weeks pt to demonstrate improved Rt knee ROM 10-105 degrees flexion.    Baseline At eval pt has 20-82  degrees in Rt knee    Time 4    Period Weeks    Status New    Target Date 08/07/19             PT Long Term Goals - 07/10/19 1743      PT LONG TERM GOAL #1   Title After 8 weeks pt to demonstrate 5xSTS from standard chair hands-free in <15 seconds, symmetrical foot placement.    Baseline Untested at eval, requires hands fro transfer    Time 8    Period Weeks    Status New    Target Date 09/04/19      PT LONG TERM GOAL #2   Title After 8 weeks pt to demonstrate improve quality and tolerance of AMB AEB <10sec, and 6MWT>1444ft with AD ad lib.    Baseline RW for AMB <0.66m/s    Time 8    Period Weeks    Status New    Target Date 09/04/19      PT LONG TERM GOAL #3   Title After 8 weeks pt to demonstrate improved ability to participate in ADL/IADL AEB FOTO score >70.    Baseline At eval FOTO: 50    Time 8    Period Weeks    Status New    Target Date 09/04/19                 Plan - 08/08/19 1048    Clinical Impression Statement Patient session severely limited by late arrival. Patient re-educated that the cutoff for being seen in 15 minutes and that this is the last time she will be seen late if she arrives late as well as the importance of PT for returning to PLOF and increasing functionality of surgical limb. Patient verbalized understanding. Patient will benefit from skilled physical therapy to reduce pain, improve mobility, and return to PLOF    Personal Factors and Comorbidities Age;Comorbidity 1;Time since onset of injury/illness/exacerbation    Comorbidities DM    Examination-Activity Limitations Bathing;Locomotion Level;Transfers;Bed Mobility;Dressing;Stand;Stairs;Squat;Sit    Examination-Participation Restrictions Cleaning;Community Activity;Meal Prep;Yard Work;Shop;Driving    Stability/Clinical Decision Making Evolving/Moderate complexity    Rehab Potential Good    PT Frequency 2x / week    PT Duration 8 weeks    PT Treatment/Interventions ADLs/Self  Care Home Management;Electrical Stimulation;Cryotherapy;Gait training;Stair training;Functional mobility training;Therapeutic activities;Therapeutic exercise;Balance training;Neuromuscular re-education;Patient/family education;Cognitive remediation;Scar mobilization;Passive range of motion;Dry needling    PT Next Visit Plan Education on scar massage,  review HEP, work on extension ROM    PT Home Exercise Plan Quad sets, LAQ, LLLD TKE stretch, heel slides, SLR    Consulted and Agree with Plan of Care Patient           Patient will benefit from skilled therapeutic intervention in order to improve the following deficits and impairments:  Abnormal gait, Decreased activity tolerance, Decreased balance, Decreased knowledge of precautions, Decreased knowledge of use of DME, Decreased mobility, Decreased range of motion, Decreased skin integrity, Decreased scar mobility, Decreased strength, Increased edema, Difficulty walking, Increased fascial restricitons, Increased muscle spasms, Postural dysfunction  Visit Diagnosis: Stiffness of right knee, not elsewhere classified  Difficulty in walking, not elsewhere classified     Problem List There are no problems to display for this patient.   Precious Bard, PT, DPT   08/08/2019, 10:50 AM  [facility] MAIN [facility] SERVICES [address] [place], [place], [address] Phone: [phone]   Fax:  [phone]  Name: Amiliah Campisi MRN: [id number] Date of Birth: 05-Jul-1945

## 2019-08-09 ENCOUNTER — Other Ambulatory Visit: Payer: Self-pay

## 2019-08-09 ENCOUNTER — Ambulatory Visit: Payer: Medicare HMO

## 2019-08-09 DIAGNOSIS — M25661 Stiffness of right knee, not elsewhere classified: Secondary | ICD-10-CM | POA: Diagnosis not present

## 2019-08-09 DIAGNOSIS — R262 Difficulty in walking, not elsewhere classified: Secondary | ICD-10-CM

## 2019-08-09 NOTE — Therapy (Signed)
[facility] MAIN [facility] SERVICES [address] [place], [place], [address] Phone: [phone]   Fax:  [phone]  Physical Therapy Treatment  Patient Details  Name: Virginia Hunt MRN: [id number] Date of Birth: Feb 12, 1945 Referring Provider (PT): Guerry Bruin MD (orthopedist)   Encounter Date: 08/09/2019   PT End of Session - 08/10/19 1208    Visit Number 8    Number of Visits 16    Date for PT Re-Evaluation 09/04/19    Authorization Type MCR    Authorization Time Period 07/10/19-09/04/19    PT Start Time 1350    PT Stop Time 1430    PT Time Calculation (min) 40 min    Equipment Utilized During Treatment Gait belt    Activity Tolerance Patient tolerated treatment well;Patient limited by pain    Behavior During Therapy [facility] for tasks assessed/performed           Past Medical History:  Diagnosis Date  . Arthritis   . Carpal tunnel syndrome   . Diabetes mellitus without complication (HCC)    pre diabetes  . Diverticulitis   . Fibromyalgia   . Hypertension     Past Surgical History:  Procedure Laterality Date  . ABDOMINAL HYSTERECTOMY    . CHOLECYSTECTOMY    . TONSILLECTOMY      There were no vitals filed for this visit.   Subjective Assessment - 08/10/19 1204    Subjective Patient reports her knee feels stiff from having to go to the airport this morning to drop off her sister. Has been doing her exercises at home, daughter is helping with overpressure.    Pertinent History Pt reports Rt TKA on Jun 04, 2019, since has been working wiht HHPT. Pt reports prior to surgery had 'years' of knee pain with cortisone injection therapy for 10-12 years. Pt reports significant limitations in basic mobility. Pt reports having had PT in the past for insidious gait instability, shuffling, LOB. Pt reports this has since resolved.    How long can you sit comfortably? ~30 minutes    How long can you stand comfortably? ~60 minutes    How long can  you walk comfortably? ~10-15 minutes (walking outside in parking lot since surgery)    Currently in Pain? Yes    Pain Score 4     Pain Location Knee    Pain Orientation Right    Pain Descriptors / Indicators Aching    Pain Type Surgical pain    Pain Onset More than a month ago    Pain Frequency Intermittent             Treatment:    Manual  -prone hip flexor/knee flexion stretch with overpressure for optimal lengthening and angle6x 30 second holds -seated contract relax flexion/extension with increasing pressure into flexion 5x 3 second holds x 2 trials -supine contract relax against PT shoulder into flexion/extension with increasing range 12x 3 second holds  -hamstring stretch 30 seconds x2 trials -IT band stretch 60 seconds x2 trials  -ice cup massage x 3 minutes at end of session -patella mobilizations: superior, inferior, medial, lateral grade II 10 seconds x 2 trials each direction -tib fib mobilization anterior/posterior with rotation component 8x 3 seconds   Therex: cueing for sequencing, body mechanics, and posture for optimal muscle recruitment: Standing by side of treadmill for support: cues for weight acceptance onto surgical limb  -march 15x each LE, focus on weight shift -hip extension 12x each LE -hip abduction 12x each  LE -hamstring curl 12x each LE  Orange hurdle: -step over and back 15x each LE; SUE support -lateral step over and back 15x each direction     -weight shift with non surgical limb on dynadisc for weight acceptance onto surgical limb 2x 60 seconds  Prone:  -hamstring curl 15x   Seated: -knee flexion with weight shift towards flexed limb 10x    Ambulation without 80 ft with focus on weight acceptance and shift onto post surgical limb,    Seated ROM:  -active 95  -overpressure 98   Pt educated throughout session about proper posture and technique with exercises. Improved exercise technique, movement at target joints, use of  target muscles after min to mod verbal, visual, tactile cues                 PT Education - 08/10/19 1208    Education Details exercise technique, weight acceptance onto surgical limb    Person(s) Educated Patient    Methods Explanation;Demonstration;Tactile cues;Verbal cues    Comprehension Verbalized understanding;Returned demonstration;Verbal cues required;Tactile cues required            PT Short Term Goals - 07/10/19 1741      PT SHORT TERM GOAL #1   Title After 2 weeks pt to demonstrate improved Rt knee extension P/ROM to ~12 degrees +/- 2 degrees.    Baseline lacks 20 degrees from neutral    Time 2    Period Weeks    Status New    Target Date 07/24/19      PT SHORT TERM GOAL #2   Title After 4 weeks pt to demonstrate improved Rt knee ROM 10-105 degrees flexion.    Baseline At eval pt has 20-82 degrees in Rt knee    Time 4    Period Weeks    Status New    Target Date 08/07/19             PT Long Term Goals - 07/10/19 1743      PT LONG TERM GOAL #1   Title After 8 weeks pt to demonstrate 5xSTS from standard chair hands-free in <15 seconds, symmetrical foot placement.    Baseline Untested at eval, requires hands fro transfer    Time 8    Period Weeks    Status New    Target Date 09/04/19      PT LONG TERM GOAL #2   Title After 8 weeks pt to demonstrate improve quality and tolerance of AMB AEB <10sec, and 6MWT>1443ft with AD ad lib.    Baseline RW for AMB <0.64m/s    Time 8    Period Weeks    Status New    Target Date 09/04/19      PT LONG TERM GOAL #3   Title After 8 weeks pt to demonstrate improved ability to participate in ADL/IADL AEB FOTO score >70.    Baseline At eval FOTO: 50    Time 8    Period Weeks    Status New    Target Date 09/04/19                 Plan - 08/10/19 1209    Clinical Impression Statement Patient progressing with weight acceptance tasks however with repeated weightbearing reports increased  discomfort/pain in lateral aspect of knee. IT band and hamstring stretches reduce the pain. Ice cup massage additionally introduced and tolerated well. Patient verbalized understanding. Patient will benefit from skilled physical therapy to reduce pain, improve mobility,  and return to PLOF    Personal Factors and Comorbidities Age;Comorbidity 1;Time since onset of injury/illness/exacerbation    Comorbidities DM    Examination-Activity Limitations Bathing;Locomotion Level;Transfers;Bed Mobility;Dressing;Stand;Stairs;Squat;Sit    Examination-Participation Restrictions Cleaning;Community Activity;Meal Prep;Yard Work;Shop;Driving    Stability/Clinical Decision Making Evolving/Moderate complexity    Rehab Potential Good    PT Frequency 2x / week    PT Duration 8 weeks    PT Treatment/Interventions ADLs/Self Care Home Management;Electrical Stimulation;Cryotherapy;Gait training;Stair training;Functional mobility training;Therapeutic activities;Therapeutic exercise;Balance training;Neuromuscular re-education;Patient/family education;Cognitive remediation;Scar mobilization;Passive range of motion;Dry needling    PT Next Visit Plan Education on scar massage, review HEP, work on extension ROM    PT Home Exercise Plan Quad sets, LAQ, LLLD TKE stretch, heel slides, SLR    Consulted and Agree with Plan of Care Patient           Patient will benefit from skilled therapeutic intervention in order to improve the following deficits and impairments:  Abnormal gait, Decreased activity tolerance, Decreased balance, Decreased knowledge of precautions, Decreased knowledge of use of DME, Decreased mobility, Decreased range of motion, Decreased skin integrity, Decreased scar mobility, Decreased strength, Increased edema, Difficulty walking, Increased fascial restricitons, Increased muscle spasms, Postural dysfunction  Visit Diagnosis: Stiffness of right knee, not elsewhere classified  Difficulty in walking, not  elsewhere classified     Problem List There are no problems to display for this patient.  Precious Bard, PT, DPT   08/10/2019, 12:10 PM  [facility] MAIN [facility] SERVICES [address] [place], [place], [address] Phone: [phone]   Fax:  [phone]  Name: Zenovia Justman MRN: [id number] Date of Birth: 18-Mar-1945

## 2019-08-14 ENCOUNTER — Other Ambulatory Visit: Payer: Self-pay

## 2019-08-14 ENCOUNTER — Ambulatory Visit: Payer: Medicare HMO

## 2019-08-14 VITALS — BP 153/99

## 2019-08-14 DIAGNOSIS — R262 Difficulty in walking, not elsewhere classified: Secondary | ICD-10-CM

## 2019-08-14 DIAGNOSIS — M25661 Stiffness of right knee, not elsewhere classified: Secondary | ICD-10-CM

## 2019-08-14 NOTE — Therapy (Signed)
[place] [facility] MAIN [facility] SERVICES [address] [place], [place], [address] Phone: [phone]   Fax:  [phone]  Physical Therapy Treatment  Patient Details  Name: Virginia Hunt MRN: [id number] Date of Birth: 20-Aug-1945 Referring Provider (PT): Guerry Bruin MD (orthopedist)   Encounter Date: 08/14/2019   PT End of Session - 08/14/19 1603    Visit Number 9    Number of Visits 16    Date for PT Re-Evaluation 09/04/19    Authorization Type MCR    Authorization Time Period 07/10/19-09/04/19    PT Start Time 1259    PT Stop Time 1343    PT Time Calculation (min) 44 min    Equipment Utilized During Treatment Gait belt    Activity Tolerance Patient tolerated treatment well;Patient limited by pain    Behavior During Therapy WFL for tasks assessed/performed           Past Medical History:  Diagnosis Date   Arthritis    Carpal tunnel syndrome    Diabetes mellitus without complication (HCC)    pre diabetes   Diverticulitis    Fibromyalgia    Hypertension     Past Surgical History:  Procedure Laterality Date   ABDOMINAL HYSTERECTOMY     CHOLECYSTECTOMY     TONSILLECTOMY      Vitals:   08/14/19 1648  BP: (!) 153/99     Subjective Assessment - 08/14/19 1601    Subjective Patients blood pressure is higher the past few days. Has been keeping an eye on it and will call physician after session.    Pertinent History Pt reports Rt TKA on Jun 04, 2019, since has been working wiht HHPT. Pt reports prior to surgery had 'years' of knee pain with cortisone injection therapy for 10-12 years. Pt reports significant limitations in basic mobility. Pt reports having had PT in the past for insidious gait instability, shuffling, LOB. Pt reports this has since resolved.    How long can you sit comfortably? ~30 minutes    How long can you stand comfortably? ~60 minutes    How long can you walk comfortably? ~10-15 minutes (walking outside in  parking lot since surgery)    Currently in Pain? Yes    Pain Score 4     Pain Location Knee    Pain Orientation Right    Pain Descriptors / Indicators Aching    Pain Type Surgical pain    Pain Onset More than a month ago    Pain Frequency Intermittent             Vitals at start of session: Left arm: 170/105; right arm 177/96  1 After sitting 5 minutes 153/99   Patient has had intermittent raised blood pressure past 3 days.    Treatment:      Manual  -prone hip flexor/knee flexion stretch with overpressure for optimal lengthening and angle 6x 30 second holds  -seated contract relax flexion/extension with increasing pressure into flexion 5x 3 second holds x 2 trials -supine contract relax against PT shoulder into flexion/extension with increasing range 12x 3 second holds  -hamstring stretch 30 seconds x2 trials -never glides with leg on PT shoulder 20x  -IT band stretch 60 seconds x2 trials   -patella mobilizations: superior, inferior, medial, lateral grade II 10 seconds x 2 trials each direction -tib fib mobilization anterior/posterior with rotation component 8x 3 seconds   seated-roller to R calf x 4 minutes   Therex: cueing for sequencing, body  mechanics, and posture for optimal muscle recruitment:  Prone:  -hamstring curl 15x    Seated: -knee flexion with weight shift towards flexed limb 10x  Ambulation without 80 ft with focus on weight acceptance and shift onto post surgical limb,   Vitals monitored throughout session to ensure therapeutic range.   End session vitals: 157/99                    PT Education - 08/14/19 1602    Education Details exercise technique, monitoring BP, calling physician    Person(s) Educated Patient    Methods Explanation;Demonstration;Tactile cues;Verbal cues    Comprehension Verbalized understanding;Returned demonstration;Verbal cues required;Tactile cues required            PT Short Term Goals - 07/10/19 1741       PT SHORT TERM GOAL #1   Title After 2 weeks pt to demonstrate improved Rt knee extension P/ROM to ~12 degrees +/- 2 degrees.    Baseline lacks 20 degrees from neutral    Time 2    Period Weeks    Status New    Target Date 07/24/19      PT SHORT TERM GOAL #2   Title After 4 weeks pt to demonstrate improved Rt knee ROM 10-105 degrees flexion.    Baseline At eval pt has 20-82 degrees in Rt knee    Time 4    Period Weeks    Status New    Target Date 08/07/19             PT Long Term Goals - 07/10/19 1743      PT LONG TERM GOAL #1   Title After 8 weeks pt to demonstrate 5xSTS from standard chair hands-free in <15 seconds, symmetrical foot placement.    Baseline Untested at eval, requires hands fro transfer    Time 8    Period Weeks    Status New    Target Date 09/04/19      PT LONG TERM GOAL #2   Title After 8 weeks pt to demonstrate improve quality and tolerance of AMB AEB <10sec, and 6MWT>1448ft with AD ad lib.    Baseline RW for AMB <0.83m/s    Time 8    Period Weeks    Status New    Target Date 09/04/19      PT LONG TERM GOAL #3   Title After 8 weeks pt to demonstrate improved ability to participate in ADL/IADL AEB FOTO score >70.    Baseline At eval FOTO: 50    Time 8    Period Weeks    Status New    Target Date 09/04/19                 Plan - 08/14/19 1651    Clinical Impression Statement Patient session limited by elevated BP. Patient agreed to call physician after session. Vitals monitored throughout session to ensure remaining in safe range.  Exercise limited with focus on manual for reduction of elevation of blood pressure. Calf roller introduced and tolerated well with patient reporting a decrease in discomfort. Patient will benefit from skilled physical therapy to reduce pain, improve mobility, and return to PLOF    Personal Factors and Comorbidities Age;Comorbidity 1;Time since onset of injury/illness/exacerbation    Comorbidities DM     Examination-Activity Limitations Bathing;Locomotion Level;Transfers;Bed Mobility;Dressing;Stand;Stairs;Squat;Sit    Examination-Participation Restrictions Cleaning;Community Activity;Meal Prep;Yard Work;Shop;Driving    Stability/Clinical Decision Making Evolving/Moderate complexity    Rehab Potential Good  PT Frequency 2x / week    PT Duration 8 weeks    PT Treatment/Interventions ADLs/Self Care Home Management;Electrical Stimulation;Cryotherapy;Gait training;Stair training;Functional mobility training;Therapeutic activities;Therapeutic exercise;Balance training;Neuromuscular re-education;Patient/family education;Cognitive remediation;Scar mobilization;Passive range of motion;Dry needling    PT Next Visit Plan Education on scar massage, review HEP, work on extension ROM    PT Home Exercise Plan Quad sets, LAQ, LLLD TKE stretch, heel slides, SLR    Consulted and Agree with Plan of Care Patient           Patient will benefit from skilled therapeutic intervention in order to improve the following deficits and impairments:  Abnormal gait, Decreased activity tolerance, Decreased balance, Decreased knowledge of precautions, Decreased knowledge of use of DME, Decreased mobility, Decreased range of motion, Decreased skin integrity, Decreased scar mobility, Decreased strength, Increased edema, Difficulty walking, Increased fascial restricitons, Increased muscle spasms, Postural dysfunction  Visit Diagnosis: Stiffness of right knee, not elsewhere classified  Difficulty in walking, not elsewhere classified     Problem List There are no problems to display for this patient.  Precious Bard, PT, DPT   08/14/2019, 4:53 PM  [place] [facility] MAIN [facility] SERVICES [address] [place], [place], [address] Phone: [phone]   Fax:  [phone]  Name: Jaclyn Andy MRN: [id number] Date of Birth: [birth year]

## 2019-08-16 ENCOUNTER — Ambulatory Visit: Payer: Medicare HMO

## 2019-08-16 ENCOUNTER — Other Ambulatory Visit: Payer: Self-pay

## 2019-08-16 DIAGNOSIS — M25661 Stiffness of right knee, not elsewhere classified: Secondary | ICD-10-CM | POA: Diagnosis not present

## 2019-08-16 DIAGNOSIS — R262 Difficulty in walking, not elsewhere classified: Secondary | ICD-10-CM

## 2019-08-16 NOTE — Therapy (Signed)
[place] MAIN [facility] SERVICES [address] [place], [place], [address] Phone: [phone]   Fax:  [phone]  Physical Therapy Treatment Physical Therapy Progress Note   Dates of reporting period  07/10/19   to   09/04/19  Patient Details  Name: Virginia Hunt MRN: [id number] Date of Birth: October 10, 1945 Referring Provider (PT): Kizzie Bane MD (orthopedist)   Encounter Date: 08/16/2019   PT End of Session - 08/16/19 1619    Visit Number 10    Number of Visits 16    Date for PT Re-Evaluation 09/04/19    Authorization Type MCR; next session 1/10 PN 7/29    Authorization Time Period 07/10/19-09/04/19    PT Start Time 1355    PT Stop Time 1429    PT Time Calculation (min) 34 min    Equipment Utilized During Treatment Gait belt    Activity Tolerance Patient tolerated treatment well;Patient limited by pain    Behavior During Therapy [facility] for tasks assessed/performed           Past Medical History:  Diagnosis Date  . Arthritis   . Carpal tunnel syndrome   . Diabetes mellitus without complication ([place])    pre diabetes  . Diverticulitis   . Fibromyalgia   . Hypertension     Past Surgical History:  Procedure Laterality Date  . ABDOMINAL HYSTERECTOMY    . CHOLECYSTECTOMY    . TONSILLECTOMY      There were no vitals filed for this visit.   Subjective Assessment - 08/16/19 1403    Subjective Patient was at primary care office prior to appointment today. Had to get blood drawn.    Pertinent History Pt reports Rt TKA on Jun 04, 2019, since has been working wiht HHPT. Pt reports prior to surgery had 'years' of knee pain with cortisone injection therapy for 10-12 years. Pt reports significant limitations in basic mobility. Pt reports having had PT in the past for insidious gait instability, shuffling, LOB. Pt reports this has since resolved.    How long can you sit comfortably? ~30 minutes    How long can you stand comfortably? ~60 minutes     How long can you walk comfortably? ~10-15 minutes (walking outside in parking lot since surgery)    Currently in Pain? Yes    Pain Score 2     Pain Location Knee    Pain Orientation Right    Pain Descriptors / Indicators Aching    Pain Type Surgical pain    Pain Onset More than a month ago    Pain Frequency Intermittent                   Vitals at start of session 132/80    PROM R knee supine: 100; seated: 102  AROM R knee supine 95; seated 96  5x STS: 15.92 seconds with R leg slightly in front of other.  10 MWT: 10.37 seconds without AD  6 minute walk test : 760 ft w/o AD ; BP at end of session: 152/78 pulse 105 FOTO: 55%   Treatment: Supine contract relax against PT overpressure for optimal knee flexion and extension 10x 10 second holds Seated contract relax against PT overpressure for optimal knee flexion 10x 10 second holds    Patient's condition has the potential to improve in response to therapy. Maximum improvement is yet to be obtained. The anticipated improvement is attainable and reasonable in a generally predictable time.  Patient reports she is  improving in her ability to stand and walk with less reliance on walker, improving her ability to flex and extend her knee.                   PT Education - 08/16/19 1403    Education Details goals,    Person(s) Educated Patient    Methods Explanation;Demonstration;Tactile cues;Verbal cues    Comprehension Verbalized understanding;Returned demonstration;Verbal cues required;Tactile cues required            PT Short Term Goals - 08/17/19 6222      PT SHORT TERM GOAL #1   Title After 2 weeks pt to demonstrate improved Rt knee extension P/ROM to ~12 degrees +/- 2 degrees.    Baseline lacks 20 degrees from neutral 7/30: -8    Time 2    Period Weeks    Status Achieved    Target Date 07/24/19      PT SHORT TERM GOAL #2   Title After 4 weeks pt to demonstrate improved Rt knee ROM 10-105 degrees  flexion.    Baseline At eval pt has 20-82 degrees in Rt knee; 7/30: 100 PROM    Time 4    Period Weeks    Status Partially Met    Target Date 08/07/19             PT Long Term Goals - 08/17/19 0810      PT LONG TERM GOAL #1   Title After 8 weeks pt to demonstrate 5xSTS from standard chair hands-free in <15 seconds, symmetrical foot placement.    Baseline Untested at eval, requires hands fro transfer 7/29: 15.92 seconds R leg slightlly in front of other    Time 8    Period Weeks    Status Partially Met    Target Date 09/04/19      PT LONG TERM GOAL #2   Title After 8 weeks pt to demonstrate improve quality and tolerance of AMB AEB 10MWT <10sec, and 6MWT>1465f with AD ad lib.    Baseline RW for AMB <0.236m 7/30: 10 MWT : 0.96 m/s w/o AD, 6 MWT 760 ft w/o AD    Time 8    Period Weeks    Status Partially Met    Target Date 09/04/19      PT LONG TERM GOAL #3   Title After 8 weeks pt to demonstrate improved ability to participate in ADL/IADL AEB FOTO score >70.    Baseline At eval FOTO: 50 7/30: 55%    Time 8    Period Weeks    Status Partially Met    Target Date 09/04/19                 Plan - 08/17/19 0808    Clinical Impression Statement Patient demonstrates excellent progression towards functional goals in regards to mobility and transfers however functional ROM continues to be an area of improvement with slower progression. Patient session is limited by late arrival again however all goals and some manual were able to be performed.  Patient's condition has the potential to improve in response to therapy. Maximum improvement is yet to be obtained. The anticipated improvement is attainable and reasonable in a generally predictable time. Patient will benefit from skilled physical therapy to reduce pain, improve mobility, and return to PLOF    Personal Factors and Comorbidities Age;Comorbidity 1;Time since onset of injury/illness/exacerbation    Comorbidities DM     Examination-Activity Limitations Bathing;Locomotion Level;Transfers;Bed Mobility;Dressing;Stand;Stairs;Squat;Sit    Examination-Participation  Restrictions Cleaning;Community Activity;Meal Prep;Yard Work;Shop;Driving    Stability/Clinical Decision Making Evolving/Moderate complexity    Rehab Potential Good    PT Frequency 2x / week    PT Duration 8 weeks    PT Treatment/Interventions ADLs/Self Care Home Management;Electrical Stimulation;Cryotherapy;Gait training;Stair training;Functional mobility training;Therapeutic activities;Therapeutic exercise;Balance training;Neuromuscular re-education;Patient/family education;Cognitive remediation;Scar mobilization;Passive range of motion;Dry needling    PT Next Visit Plan Education on scar massage, review HEP, work on extension ROM    PT Home Exercise Plan Quad sets, LAQ, LLLD TKE stretch, heel slides, SLR    Consulted and Agree with Plan of Care Patient           Patient will benefit from skilled therapeutic intervention in order to improve the following deficits and impairments:  Abnormal gait, Decreased activity tolerance, Decreased balance, Decreased knowledge of precautions, Decreased knowledge of use of DME, Decreased mobility, Decreased range of motion, Decreased skin integrity, Decreased scar mobility, Decreased strength, Increased edema, Difficulty walking, Increased fascial restricitons, Increased muscle spasms, Postural dysfunction  Visit Diagnosis: Stiffness of right knee, not elsewhere classified  Difficulty in walking, not elsewhere classified     Problem List There are no problems to display for this patient.  Janna Arch, PT, DPT   08/17/2019, 8:13 AM  [place] MAIN [facility] SERVICES [address] [place], [place], [address] Phone: [phone]   Fax:  [phone]  Name: Virginia Hunt MRN: [id number] Date of Birth: 10/31/1945

## 2019-08-21 ENCOUNTER — Other Ambulatory Visit: Payer: Self-pay

## 2019-08-21 ENCOUNTER — Ambulatory Visit: Payer: Medicare HMO | Attending: Orthopedic Surgery

## 2019-08-21 DIAGNOSIS — R262 Difficulty in walking, not elsewhere classified: Secondary | ICD-10-CM | POA: Diagnosis present

## 2019-08-21 DIAGNOSIS — M25661 Stiffness of right knee, not elsewhere classified: Secondary | ICD-10-CM | POA: Insufficient documentation

## 2019-08-21 NOTE — Therapy (Signed)
[place] MAIN [facility] SERVICES [address] [place], [place], [address] Phone: [phone]   Fax:  [phone]  Physical Therapy Treatment  Patient Details  Name: Virginia Hunt MRN: [id number] Date of Birth: 08-07-1945 Referring Provider (PT): Kizzie Bane MD (orthopedist)   Encounter Date: 08/21/2019   PT End of Session - 08/21/19 1353    Visit Number 11    Number of Visits 16    Date for PT Re-Evaluation 09/04/19    Authorization Type MCR;  1/10 PN 7/29    Authorization Time Period 07/10/19-09/04/19    PT Start Time 1347    PT Stop Time 1429    PT Time Calculation (min) 42 min    Equipment Utilized During Treatment Gait belt    Activity Tolerance Patient tolerated treatment well;Patient limited by pain    Behavior During Therapy [facility] for tasks assessed/performed           Past Medical History:  Diagnosis Date  . Arthritis   . Carpal tunnel syndrome   . Diabetes mellitus without complication ([place])    pre diabetes  . Diverticulitis   . Fibromyalgia   . Hypertension     Past Surgical History:  Procedure Laterality Date  . ABDOMINAL HYSTERECTOMY    . CHOLECYSTECTOMY    . TONSILLECTOMY      There were no vitals filed for this visit.   Subjective Assessment - 08/21/19 1352    Subjective Patient reports her knee has been swelling a little bit. Low pain levels but does have a limb.    Pertinent History Pt reports Rt TKA on Jun 04, 2019, since has been working wiht HHPT. Pt reports prior to surgery had 'years' of knee pain with cortisone injection therapy for 10-12 years. Pt reports significant limitations in basic mobility. Pt reports having had PT in the past for insidious gait instability, shuffling, LOB. Pt reports this has since resolved.    How long can you sit comfortably? ~30 minutes    How long can you stand comfortably? ~60 minutes    How long can you walk comfortably? ~10-15 minutes (walking outside in parking lot since  surgery)    Currently in Pain? Yes    Pain Score 2     Pain Location Knee    Pain Orientation Right    Pain Descriptors / Indicators Aching    Pain Type Surgical pain    Pain Onset More than a month ago    Pain Frequency Intermittent               Nustep Lvl 1 RPM>70 focus on knee flexion/extension; 4 minutes  Vitals: 153/83 pulse 93 Therex: cueing for sequencing, body mechanics, and posture for optimal muscle recruitment: Staircase; close CGA and cues for body mechanics/ sequencing  -6" step eccentric step down 12x each LE 6" step BUE support step up/down focus on weight shift/acceptance onto surgical limb 6" step side step up/down entire staircase x 2 sets each side ; BUE on single rail 6 " step airex pad modified tandem stance 2x 60 seconds each LE placement.    Prone:  -hip extension with Min A RLE 15x  -hamstring curl 15x RLE     Manual  -prone hip flexor/knee flexion stretch with overpressure for optimal lengthening and angle 6x 30 second holds  -supine: ice cup massage x 4 minutes at end of session  -patella mobilizations: superior, inferior, medial, lateral grade II 10 seconds x 2 trials each direction -  tib fib mobilization anterior/posterior with rotation component 8x 3 seconds      Pt educated throughout session about proper posture and technique with exercises. Improved exercise technique, movement at target joints, use of target muscles after min to mod verbal, visual, tactile cues              PT Education - 08/21/19 1353    Education Details exercise technique, body mechanics    Person(s) Educated Patient    Methods Explanation;Demonstration;Tactile cues;Verbal cues    Comprehension Verbalized understanding;Returned demonstration;Verbal cues required;Tactile cues required            PT Short Term Goals - 08/17/19 5615      PT SHORT TERM GOAL #1   Title After 2 weeks pt to demonstrate improved Rt knee extension P/ROM to ~12 degrees +/- 2  degrees.    Baseline lacks 20 degrees from neutral 7/30: -8    Time 2    Period Weeks    Status Achieved    Target Date 07/24/19      PT SHORT TERM GOAL #2   Title After 4 weeks pt to demonstrate improved Rt knee ROM 10-105 degrees flexion.    Baseline At eval pt has 20-82 degrees in Rt knee; 7/30: 100 PROM    Time 4    Period Weeks    Status Partially Met    Target Date 08/07/19             PT Long Term Goals - 08/17/19 0810      PT LONG TERM GOAL #1   Title After 8 weeks pt to demonstrate 5xSTS from standard chair hands-free in <15 seconds, symmetrical foot placement.    Baseline Untested at eval, requires hands fro transfer 7/29: 15.92 seconds R leg slightlly in front of other    Time 8    Period Weeks    Status Partially Met    Target Date 09/04/19      PT LONG TERM GOAL #2   Title After 8 weeks pt to demonstrate improve quality and tolerance of AMB AEB 10MWT <10sec, and 6MWT>1419f with AD ad lib.    Baseline RW for AMB <0.276m 7/30: 10 MWT : 0.96 m/s w/o AD, 6 MWT 760 ft w/o AD    Time 8    Period Weeks    Status Partially Met    Target Date 09/04/19      PT LONG TERM GOAL #3   Title After 8 weeks pt to demonstrate improved ability to participate in ADL/IADL AEB FOTO score >70.    Baseline At eval FOTO: 50 7/30: 55%    Time 8    Period Weeks    Status Partially Met    Target Date 09/04/19                 Plan - 08/21/19 1525    Clinical Impression Statement Patient continues to be challenged with weight acceptance and stabilization on post surgical limb with cues required for task performance and encouragement. Increased confidence in limb noted with repeated task performance. Icing education re-educated on due to patients increased swelling. Patient will benefit from skilled physical therapy to reduce pain, improve mobility, and return to PLOF    Personal Factors and Comorbidities Age;Comorbidity 1;Time since onset of injury/illness/exacerbation     Comorbidities DM    Examination-Activity Limitations Bathing;Locomotion Level;Transfers;Bed Mobility;Dressing;Stand;Stairs;Squat;Sit    Examination-Participation Restrictions Cleaning;Community Activity;Meal Prep;Yard Work;Shop;Driving    Stability/Clinical Decision Making Evolving/Moderate complexity  Rehab Potential Good    PT Frequency 2x / week    PT Duration 8 weeks    PT Treatment/Interventions ADLs/Self Care Home Management;Electrical Stimulation;Cryotherapy;Gait training;Stair training;Functional mobility training;Therapeutic activities;Therapeutic exercise;Balance training;Neuromuscular re-education;Patient/family education;Cognitive remediation;Scar mobilization;Passive range of motion;Dry needling    PT Next Visit Plan weight acceptance, strength, increase ROM    PT Home Exercise Plan Quad sets, LAQ, LLLD TKE stretch, heel slides, SLR    Consulted and Agree with Plan of Care Patient           Patient will benefit from skilled therapeutic intervention in order to improve the following deficits and impairments:  Abnormal gait, Decreased activity tolerance, Decreased balance, Decreased knowledge of precautions, Decreased knowledge of use of DME, Decreased mobility, Decreased range of motion, Decreased skin integrity, Decreased scar mobility, Decreased strength, Increased edema, Difficulty walking, Increased fascial restricitons, Increased muscle spasms, Postural dysfunction  Visit Diagnosis: Stiffness of right knee, not elsewhere classified  Difficulty in walking, not elsewhere classified     Problem List There are no problems to display for this patient.  Janna Arch, PT, DPT   08/21/2019, 3:28 PM  [place] MAIN [facility] SERVICES [address] [place], [place], [address] Phone: [phone]   Fax:  [phone]  Name: Jaylynn Mcaleer MRN: [id number] Date of Birth: 1945-06-21

## 2019-08-23 ENCOUNTER — Ambulatory Visit: Payer: Medicare HMO

## 2019-08-23 ENCOUNTER — Other Ambulatory Visit: Payer: Self-pay

## 2019-08-23 DIAGNOSIS — M25661 Stiffness of right knee, not elsewhere classified: Secondary | ICD-10-CM | POA: Diagnosis not present

## 2019-08-23 DIAGNOSIS — R262 Difficulty in walking, not elsewhere classified: Secondary | ICD-10-CM

## 2019-08-23 NOTE — Therapy (Signed)
[place] MAIN [facility] SERVICES [address] [place], [place], [address] Phone: [phone]   Fax:  [phone]  Physical Therapy Treatment  Patient Details  Name: Virginia Hunt MRN: [id number] Date of Birth: [birth year] Referring Provider (PT): Kizzie Bane MD (orthopedist)   Encounter Date: 08/23/2019   PT End of Session - 08/23/19 1754    Visit Number 12    Number of Visits 16    Date for PT Re-Evaluation 09/04/19    Authorization Type MCR;  2/10 PN 7/29    Authorization Time Period 07/10/19-09/04/19    PT Start Time 1355    PT Stop Time 1435    PT Time Calculation (min) 40 min    Equipment Utilized During Treatment Gait belt    Activity Tolerance Patient tolerated treatment well;Patient limited by pain    Behavior During Therapy [facility] for tasks assessed/performed           Past Medical History:  Diagnosis Date  . Arthritis   . Carpal tunnel syndrome   . Diabetes mellitus without complication ([place])    pre diabetes  . Diverticulitis   . Fibromyalgia   . Hypertension     Past Surgical History:  Procedure Laterality Date  . ABDOMINAL HYSTERECTOMY    . CHOLECYSTECTOMY    . TONSILLECTOMY      There were no vitals filed for this visit.   Subjective Assessment - 08/23/19 1355    Subjective Pt continues to report swelling in her knee.  Her knee feels bit stiff/tight this afternoon.    Pertinent History Pt reports Rt TKA on Jun 04, 2019, since has been working wiht HHPT. Pt reports prior to surgery had 'years' of knee pain with cortisone injection therapy for 10-12 years. Pt reports significant limitations in basic mobility. Pt reports having had PT in the past for insidious gait instability, shuffling, LOB. Pt reports this has since resolved.    How long can you sit comfortably? ~30 minutes    How long can you stand comfortably? ~60 minutes    How long can you walk comfortably? ~10-15 minutes (walking outside in parking lot since  surgery)    Currently in Pain? Yes    Pain Score 2     Pain Location Knee    Pain Orientation Right    Pain Onset More than a month ago           Treatment Today:  Nustep Lvl 1 RPM>70 focus on knee flexion/extension; 5 minutes Step ups: ascend/desced 4 six inch steps, 2 laps with min UE support and reciprocal gait pattern Quad set 5 sec x10 in knee extension Amb with FWW 2 laps around gym; pt has difficulty achieving R heel strike    Manual Tx: -prone hip flexor/knee flexion stretch with overpressure for optimal lengthening and angle6x 30 second holds, contract relax (quad) with manual stretch 5 x -patella mobilizations: superior, inferior, medial, lateral grade II 10 seconds x 2 trials each direction -tib fib mobilization anterior/posterior with rotation component8x 3 seconds -STM gastroc/hamstrings with heel prop on towel roll in extension   Pt educated throughout session about proper posture and technique with exercises. Improved exercise technique, movement at target joints, use of target muscles after min to mod verbal, visual, tactile cues             PT Education - 08/23/19 1400    Education Details exercise technique    Person(s) Educated Patient    Methods Explanation;Demonstration;Tactile cues  Comprehension Verbalized understanding;Returned demonstration            PT Short Term Goals - 08/17/19 0812      PT SHORT TERM GOAL #1   Title After 2 weeks pt to demonstrate improved Rt knee extension P/ROM to ~12 degrees +/- 2 degrees.    Baseline lacks 20 degrees from neutral 7/30: -8    Time 2    Period Weeks    Status Achieved    Target Date 07/24/19      PT SHORT TERM GOAL #2   Title After 4 weeks pt to demonstrate improved Rt knee ROM 10-105 degrees flexion.    Baseline At eval pt has 20-82 degrees in Rt knee; 7/30: 100 PROM    Time 4    Period Weeks    Status Partially Met    Target Date 08/07/19             PT Long Term Goals -  08/17/19 0810      PT LONG TERM GOAL #1   Title After 8 weeks pt to demonstrate 5xSTS from standard chair hands-free in <15 seconds, symmetrical foot placement.    Baseline Untested at eval, requires hands fro transfer 7/29: 15.92 seconds R leg slightlly in front of other    Time 8    Period Weeks    Status Partially Met    Target Date 09/04/19      PT LONG TERM GOAL #2   Title After 8 weeks pt to demonstrate improve quality and tolerance of AMB AEB 10MWT <10sec, and 6MWT>1400ft with AD ad lib.    Baseline RW for AMB <0.25m/s 7/30: 10 MWT : 0.96 m/s w/o AD, 6 MWT 760 ft w/o AD    Time 8    Period Weeks    Status Partially Met    Target Date 09/04/19      PT LONG TERM GOAL #3   Title After 8 weeks pt to demonstrate improved ability to participate in ADL/IADL AEB FOTO score >70.    Baseline At eval FOTO: 50 7/30: 55%    Time 8    Period Weeks    Status Partially Met    Target Date 09/04/19                 Plan - 08/23/19 1755    Clinical Impression Statement Pt's knee stiffness is affecting her ability to achieve normal heel strike on R during gait cycle.  She tolerated manual tx well for knee extension ROM deficits; no immediate improvement in heel strike noted after tx though today. Pt will cont to benefit from skilled PT to reduce pain, improve mobility, and return to PLOF.    Personal Factors and Comorbidities Age;Comorbidity 1;Time since onset of injury/illness/exacerbation    Comorbidities DM    Examination-Activity Limitations Bathing;Locomotion Level;Transfers;Bed Mobility;Dressing;Stand;Stairs;Squat;Sit    Examination-Participation Restrictions Cleaning;Community Activity;Meal Prep;Yard Work;Shop;Driving    Stability/Clinical Decision Making Evolving/Moderate complexity    Rehab Potential Good    PT Frequency 2x / week    PT Duration 8 weeks    PT Treatment/Interventions ADLs/Self Care Home Management;Electrical Stimulation;Cryotherapy;Gait training;Stair  training;Functional mobility training;Therapeutic activities;Therapeutic exercise;Balance training;Neuromuscular re-education;Patient/family education;Cognitive remediation;Scar mobilization;Passive range of motion;Dry needling    PT Next Visit Plan weight acceptance, strength, increase ROM    PT Home Exercise Plan Quad sets, LAQ, LLLD TKE stretch, heel slides, SLR    Consulted and Agree with Plan of Care Patient             Patient will benefit from skilled therapeutic intervention in order to improve the following deficits and impairments:  Abnormal gait, Decreased activity tolerance, Decreased balance, Decreased knowledge of precautions, Decreased knowledge of use of DME, Decreased mobility, Decreased range of motion, Decreased skin integrity, Decreased scar mobility, Decreased strength, Increased edema, Difficulty walking, Increased fascial restricitons, Increased muscle spasms, Postural dysfunction  Visit Diagnosis: Stiffness of right knee, not elsewhere classified  Difficulty in walking, not elsewhere classified     Problem List There are no problems to display for this patient.   Pincus Badder 08/23/2019, 5:58 PM Merdis Delay, PT, DPT Physical Therapist - [facility]  Outpatient Physical Therapy- [place] MAIN [facility] SERVICES [address] [place], [place], [address] Phone: [phone]   Fax:  [phone]  Name: Kaitlin Alcindor MRN: [id number] Date of Birth: 12/10/1945

## 2019-08-28 ENCOUNTER — Ambulatory Visit: Payer: Medicare HMO

## 2019-08-30 ENCOUNTER — Other Ambulatory Visit: Payer: Self-pay

## 2019-08-30 ENCOUNTER — Ambulatory Visit: Payer: Medicare HMO

## 2019-08-30 DIAGNOSIS — M25661 Stiffness of right knee, not elsewhere classified: Secondary | ICD-10-CM

## 2019-08-30 DIAGNOSIS — R262 Difficulty in walking, not elsewhere classified: Secondary | ICD-10-CM

## 2019-08-30 NOTE — Therapy (Signed)
[place] MAIN [facility] SERVICES [address] [place], [place], [address] Phone: [phone]   Fax:  [phone]  Physical Therapy Treatment  Patient Details  Name: Virginia Hunt MRN: [id number] Date of Birth: 1945-06-14 Referring Provider (PT): Kizzie Bane MD (orthopedist)   Encounter Date: 08/30/2019   PT End of Session - 08/30/19 1357    Visit Number 13    Number of Visits 16    Date for PT Re-Evaluation 09/04/19    Authorization Type MCR;  2/10 PN 7/29    Authorization Time Period 07/10/19-09/04/19    PT Start Time 1350    PT Stop Time 1429    PT Time Calculation (min) 39 min    Equipment Utilized During Treatment Gait belt    Activity Tolerance Patient tolerated treatment well;Patient limited by pain    Behavior During Therapy WFL for tasks assessed/performed           Past Medical History:  Diagnosis Date   Arthritis    Carpal tunnel syndrome    Diabetes mellitus without complication ([place])    pre diabetes   Diverticulitis    Fibromyalgia    Hypertension     Past Surgical History:  Procedure Laterality Date   ABDOMINAL HYSTERECTOMY     CHOLECYSTECTOMY     TONSILLECTOMY      There were no vitals filed for this visit.   Subjective Assessment - 08/30/19 1356    Subjective Has a new knee extender brace, is supposed to wear it three times a day for 30 minutes at a time. Is irritating the side of her knee where the knobs tighten it.  Went to see her physician this morning and was told to keep with her medications she is on.    Pertinent History Pt reports Rt TKA on Jun 04, 2019, since has been working wiht HHPT. Pt reports prior to surgery had 'years' of knee pain with cortisone injection therapy for 10-12 years. Pt reports significant limitations in basic mobility. Pt reports having had PT in the past for insidious gait instability, shuffling, LOB. Pt reports this has since resolved.    How long can you sit  comfortably? ~30 minutes    How long can you stand comfortably? ~60 minutes    How long can you walk comfortably? ~10-15 minutes (walking outside in parking lot since surgery)    Currently in Pain? Yes    Pain Score 1     Pain Location Knee    Pain Orientation Right    Pain Descriptors / Indicators Aching    Pain Type Surgical pain    Pain Onset More than a month ago    Pain Frequency Intermittent           Has a new knee extender brace, is supposed to wear it three times a day for 30 minutes at a time. Is irritating the side of her knee where the knobs tighten it.  Went to see her physician this morning and was told to keep with her medications she is on.   Nustep seat 11 for first minute and then 9 for second minute for extension and flexion overpressure level 2  in // bars:  -bosu ball modified lunge, decrease to SUE support 10x each LE -speed ladder one foot each square, decrease to no UE support. 10x length of // bar; challenging to stability -toy soldier ambulation with focus on heel strike 4x length of // bars    Manual Tx: -  prone hip flexor/knee flexion stretch with overpressure for optimal lengthening and angle 6x 30 second holds, contract relax (quad) with manual stretch 5 x  -patella mobilizations: superior, inferior, medial, lateral grade II 10 seconds x 2 trials each direction -tib fib mobilization anterior/posterior with rotation component 8x 3 seconds  -scar tissue massage x3 minutes with cross friction tolerated well    Pt educated throughout session about proper posture and technique with exercises. Improved exercise technique, movement at target joints, use of target muscles after min to mod verbal, visual, tactile cues                     PT Education - 08/30/19 1356    Education Details exercise technique, body mechanics    Person(s) Educated Patient    Methods Explanation;Demonstration;Tactile cues;Verbal cues    Comprehension Verbalized  understanding;Returned demonstration;Verbal cues required;Tactile cues required            PT Short Term Goals - 08/17/19 2620      PT SHORT TERM GOAL #1   Title After 2 weeks pt to demonstrate improved Rt knee extension P/ROM to ~12 degrees +/- 2 degrees.    Baseline lacks 20 degrees from neutral 7/30: -8    Time 2    Period Weeks    Status Achieved    Target Date 07/24/19      PT SHORT TERM GOAL #2   Title After 4 weeks pt to demonstrate improved Rt knee ROM 10-105 degrees flexion.    Baseline At eval pt has 20-82 degrees in Rt knee; 7/30: 100 PROM    Time 4    Period Weeks    Status Partially Met    Target Date 08/07/19             PT Long Term Goals - 08/17/19 0810      PT LONG TERM GOAL #1   Title After 8 weeks pt to demonstrate 5xSTS from standard chair hands-free in <15 seconds, symmetrical foot placement.    Baseline Untested at eval, requires hands fro transfer 7/29: 15.92 seconds R leg slightlly in front of other    Time 8    Period Weeks    Status Partially Met    Target Date 09/04/19      PT LONG TERM GOAL #2   Title After 8 weeks pt to demonstrate improve quality and tolerance of AMB AEB 10MWT <10sec, and 6MWT>1432f with AD ad lib.    Baseline RW for AMB <0.273m 7/30: 10 MWT : 0.96 m/s w/o AD, 6 MWT 760 ft w/o AD    Time 8    Period Weeks    Status Partially Met    Target Date 09/04/19      PT LONG TERM GOAL #3   Title After 8 weeks pt to demonstrate improved ability to participate in ADL/IADL AEB FOTO score >70.    Baseline At eval FOTO: 50 7/30: 55%    Time 8    Period Weeks    Status Partially Met    Target Date 09/04/19                 Plan - 08/31/19 1050    Clinical Impression Statement Patient is progressing with functional mobility as well ability to weight shift onto surgical limb. Increased swelling is noted with additional protrusion of scar tissue. Improved heel strike by end of session performed demonstrating patient  carryover and improved control. Patient will benefit from skilled physical  therapy to reduce pain, improve mobility, and return to PLOF    Personal Factors and Comorbidities Age;Comorbidity 1;Time since onset of injury/illness/exacerbation    Comorbidities DM    Examination-Activity Limitations Bathing;Locomotion Level;Transfers;Bed Mobility;Dressing;Stand;Stairs;Squat;Sit    Examination-Participation Restrictions Cleaning;Community Activity;Meal Prep;Yard Work;Shop;Driving    Stability/Clinical Decision Making Evolving/Moderate complexity    Rehab Potential Good    PT Frequency 2x / week    PT Duration 8 weeks    PT Treatment/Interventions ADLs/Self Care Home Management;Electrical Stimulation;Cryotherapy;Gait training;Stair training;Functional mobility training;Therapeutic activities;Therapeutic exercise;Balance training;Neuromuscular re-education;Patient/family education;Cognitive remediation;Scar mobilization;Passive range of motion;Dry needling    PT Next Visit Plan weight acceptance, strength, increase ROM    PT Home Exercise Plan Quad sets, LAQ, LLLD TKE stretch, heel slides, SLR    Consulted and Agree with Plan of Care Patient           Patient will benefit from skilled therapeutic intervention in order to improve the following deficits and impairments:  Abnormal gait, Decreased activity tolerance, Decreased balance, Decreased knowledge of precautions, Decreased knowledge of use of DME, Decreased mobility, Decreased range of motion, Decreased skin integrity, Decreased scar mobility, Decreased strength, Increased edema, Difficulty walking, Increased fascial restricitons, Increased muscle spasms, Postural dysfunction  Visit Diagnosis: Stiffness of right knee, not elsewhere classified  Difficulty in walking, not elsewhere classified     Problem List There are no problems to display for this patient.  Janna Arch, PT, DPT   08/31/2019, 10:52 AM  [place] MAIN [facility] SERVICES [address] [place], [place], [address] Phone: [phone]   Fax:  [phone]  Name: Virginia Hunt MRN: [id number] Date of Birth: 11-Apr-1945

## 2019-09-04 ENCOUNTER — Other Ambulatory Visit: Payer: Self-pay

## 2019-09-04 ENCOUNTER — Ambulatory Visit: Payer: Medicare HMO

## 2019-09-04 DIAGNOSIS — M25661 Stiffness of right knee, not elsewhere classified: Secondary | ICD-10-CM | POA: Diagnosis not present

## 2019-09-04 DIAGNOSIS — R262 Difficulty in walking, not elsewhere classified: Secondary | ICD-10-CM

## 2019-09-04 NOTE — Therapy (Signed)
[place] MAIN [facility] SERVICES [address] [place], [place], [address] Phone: [phone]   Fax:  [phone]  Physical Therapy Treatment/RECERT  Patient Details  Name: Virginia Hunt MRN: [id number] Date of Birth: 09-Nov-1945 Referring Provider (PT): Kizzie Bane MD (orthopedist)   Encounter Date: 09/04/2019   PT End of Session - 09/04/19 1635    Visit Number 14    Number of Visits 30    Date for PT Re-Evaluation 10/30/19    Authorization Type MCR;  4/10 PN 7/29    Authorization Time Period 07/10/19-09/04/19    PT Start Time 1352    PT Stop Time 1430    PT Time Calculation (min) 38 min    Equipment Utilized During Treatment Gait belt    Activity Tolerance Patient tolerated treatment well    Behavior During Therapy [facility] for tasks assessed/performed           Past Medical History:  Diagnosis Date  . Arthritis   . Carpal tunnel syndrome   . Diabetes mellitus without complication ([place])    pre diabetes  . Diverticulitis   . Fibromyalgia   . Hypertension     Past Surgical History:  Procedure Laterality Date  . ABDOMINAL HYSTERECTOMY    . CHOLECYSTECTOMY    . TONSILLECTOMY      There were no vitals filed for this visit.   Subjective Assessment - 09/04/19 1634    Subjective Patient went to pain clinic prior to her appointment with this therapist. Feels like she is at 80% back to normal.    Pertinent History Pt reports Rt TKA on Jun 04, 2019, since has been working wiht HHPT. Pt reports prior to surgery had 'years' of knee pain with cortisone injection therapy for 10-12 years. Pt reports significant limitations in basic mobility. Pt reports having had PT in the past for insidious gait instability, shuffling, LOB. Pt reports this has since resolved.    How long can you sit comfortably? ~30 minutes    How long can you stand comfortably? ~60 minutes    How long can you walk comfortably? ~10-15 minutes (walking outside in parking lot  since surgery)    Currently in Pain? No/denies            vitals at start of session: 149/73 pulse 93    [facility] PT Assessment - 09/04/19 0001      Standardized Balance Assessment   Standardized Balance Assessment Berg Balance Test      Berg Balance Test   Sit to Stand Able to stand without using hands and stabilize independently    Standing Unsupported Able to stand safely 2 minutes    Sitting with Back Unsupported but Feet Supported on Floor or Stool Able to sit safely and securely 2 minutes    Stand to Sit Sits safely with minimal use of hands    Transfers Able to transfer safely, minor use of hands    Standing Unsupported with Eyes Closed Able to stand 10 seconds with supervision    Standing Unsupported with Feet Together Able to place feet together independently and stand for 1 minute with supervision    From Standing, Reach Forward with Outstretched Arm Can reach forward >12 cm safely (5")    From Standing Position, Pick up Object from Floor Able to pick up shoe, needs supervision    From Standing Position, Turn to Look Behind Over each Shoulder Looks behind from both sides and weight shifts well  Turn 360 Degrees Able to turn 360 degrees safely but slowly    Standing Unsupported, Alternately Place Feet on Step/Stool Able to stand independently and complete 8 steps >20 seconds    Standing Unsupported, One Foot in Front Able to take small step independently and hold 30 seconds    Standing on One Leg Able to lift leg independently and hold equal to or more than 3 seconds    Total Score 45              Goals: ROM:  Seated AROM: 104 flexion to -4 extension  Supine with overpressure 105 flexion to -3 extension   5x STS: 10.8 seconds  10 MWT: 7.45 seconds w/o AD  6 MWT : deferred due to time.  FOTO: 56%    New goal : BERG: 45/ 56   patient reports she is 80% better but is still limited by balance.   Treatment: -prone hip flexor/knee flexion stretch with  overpressure for optimal lengthening and angle6x 30 second holds, contract relax (quad) with manual stretch 5 x Seated contract relax with overpressure into increasing knee flexion 10x 5 second holds   Access Code: [email or web address] URL: [email or web address] Date: 09/04/2019 Prepared by: Janna Arch  Exercises Standing Tandem Balance with Counter Support - 1 x daily - 7 x weekly - 2 sets - 2 reps - 30 hold Standing Single Leg Stance with Counter Support - 1 x daily - 7 x weekly - 2 sets - 2 reps - 30 hold Narrow Stance with Counter Support - 1 x daily - 7 x weekly - 2 sets - 2 reps - 30 hold Narrow Stance with Head Nods and Counter Support - 1 x daily - 7 x weekly - 2 sets - 10 reps - 5 hold                PT Education - 09/04/19 1635    Education Details goals, POC , HEP    Person(s) Educated Patient    Methods Explanation;Demonstration;Tactile cues;Verbal cues;Handout    Comprehension Verbalized understanding;Returned demonstration;Verbal cues required;Tactile cues required            PT Short Term Goals - 09/04/19 1638      PT SHORT TERM GOAL #1   Title After 2 weeks pt to demonstrate improved Rt knee extension P/ROM to ~12 degrees +/- 2 degrees.    Baseline lacks 20 degrees from neutral 7/30: -8    Time 2    Period Weeks    Status Achieved    Target Date 07/24/19      PT SHORT TERM GOAL #2   Title After 4 weeks pt to demonstrate improved Rt knee ROM 10-105 degrees flexion.    Baseline At eval pt has 20-82 degrees in Rt knee; 7/30: 100 PROM 8/17: 104 AROM 105 with overpressure    Time 4    Period Weeks    Status Achieved    Target Date 08/07/19             PT Long Term Goals - 09/04/19 1410      PT LONG TERM GOAL #1   Title After 8 weeks pt to demonstrate 5xSTS from standard chair hands-free in <15 seconds, symmetrical foot placement.    Baseline Untested at eval, requires hands fro transfer 7/29: 15.92 seconds R leg slightlly in front of  other 8/17: 10.8 seconds w/o UE support    Time 8    Period Weeks    Status Achieved  PT LONG TERM GOAL #2   Title After 8 weeks pt to demonstrate improve quality and tolerance of AMB AEB 10MWT <10sec, and 6MWT>1433f with AD ad lib.    Baseline RW for AMB <0.28m 7/30: 10 MWT : 0.96 m/s w/o AD, 6 MWT 760 ft w/o AD 8/17: 10 MWT 1.4 m/s 6 MWT deferred    Time 8    Period Weeks    Status Partially Met    Target Date 10/30/19      PT LONG TERM GOAL #3   Title After 8 weeks pt to demonstrate improved ability to participate in ADL/IADL AEB FOTO score >70.    Baseline At eval FOTO: 50 7/30: 55% 8/17: 56%    Time 8    Period Weeks    Status Partially Met    Target Date 10/30/19      PT LONG TERM GOAL #4   Title Patient will demonstrate an improved Berg Balance Score of > 51/56 as to demonstrate improved balance with ADLs such as sitting/standing and transfer balance and reduced fall risk.    Baseline 8/14: 45/56    Time 8    Period Weeks    Status New    Target Date 10/30/19                 Plan - 09/04/19 1637    Clinical Impression Statement Patient has met 5x STS goal and 10 MWT goal. Her AROM of surgical limb is improving however is not yet at full potential. Additional goal for balance added due to patient's limitations with stability.  Patient continues to be challenged with weight acceptance and stabilization on post surgical limb with cues required for task performance and encouragement. Patient will benefit from skilled physical therapy to reduce pain, improve mobility, and return to PLOF    Personal Factors and Comorbidities Age;Comorbidity 1;Time since onset of injury/illness/exacerbation    Comorbidities DM    Examination-Activity Limitations Bathing;Locomotion Level;Transfers;Bed Mobility;Dressing;Stand;Stairs;Squat;Sit    Examination-Participation Restrictions Cleaning;Community Activity;Meal Prep;Yard Work;Shop;Driving    Stability/Clinical Decision Making  Evolving/Moderate complexity    Rehab Potential Good    PT Frequency 2x / week    PT Duration 8 weeks    PT Treatment/Interventions ADLs/Self Care Home Management;Electrical Stimulation;Cryotherapy;Gait training;Stair training;Functional mobility training;Therapeutic activities;Therapeutic exercise;Balance training;Neuromuscular re-education;Patient/family education;Cognitive remediation;Scar mobilization;Passive range of motion;Dry needling    PT Next Visit Plan weight acceptance, strength, increase ROM    PT Home Exercise Plan Quad sets, LAQ, LLLD TKE stretch, heel slides, SLR    Consulted and Agree with Plan of Care Patient           Patient will benefit from skilled therapeutic intervention in order to improve the following deficits and impairments:  Abnormal gait, Decreased activity tolerance, Decreased balance, Decreased knowledge of precautions, Decreased knowledge of use of DME, Decreased mobility, Decreased range of motion, Decreased skin integrity, Decreased scar mobility, Decreased strength, Increased edema, Difficulty walking, Increased fascial restricitons, Increased muscle spasms, Postural dysfunction  Visit Diagnosis: Stiffness of right knee, not elsewhere classified  Difficulty in walking, not elsewhere classified     Problem List There are no problems to display for this patient.  MaJanna ArchPT, DPT   09/04/2019, 4:42 PM  [place] REL[facility] LtdERVICES [address]hone: [phone] Fax:  [phone]: SaAyaan RingleRN: [id number] of Birth: 02/1945-02-10

## 2019-09-06 ENCOUNTER — Ambulatory Visit: Payer: Medicare HMO

## 2019-09-12 ENCOUNTER — Ambulatory Visit: Payer: Self-pay

## 2019-09-12 DIAGNOSIS — R262 Difficulty in walking, not elsewhere classified: Secondary | ICD-10-CM

## 2019-09-12 DIAGNOSIS — M25661 Stiffness of right knee, not elsewhere classified: Secondary | ICD-10-CM

## 2019-09-12 NOTE — Therapy (Signed)
[place] MAIN [facility] SERVICES [address] [place], [place], [address] Phone: [phone]   Fax:  [phone]  September 12, 2019   No Recipients  Physical Therapy Discharge Summary  Patient: Virginia Hunt  MRN: [id number]  Date of Birth: 1945-01-22   Diagnosis: No diagnosis found. Referring Provider (PT): Kizzie Bane MD (orthopedist)   The above patient had been seen in Physical Therapy 14  times of 30  treatments scheduled with no no shows and four cancellations.  The treatment consisted of manual and therex The patient is: Improved  Subjective: Patient declines further PT at this time due to other health problems and not having time for it.   Discharge Findings: Not seen for discharge visit.   Functional Status at Discharge: Not seen for discharge visit.   Goals Partially Met    Sincerely,   Janna Arch, PT, DPT   CC No Recipients  [place] MAIN [facility] SERVICES [address] [place], [place], [address] Phone: [phone]   Fax:  [phone]  Patient: Virginia Hunt  MRN: [id number]  Date of Birth: 11-Apr-1945

## 2019-10-02 ENCOUNTER — Other Ambulatory Visit: Payer: Self-pay

## 2019-10-02 ENCOUNTER — Ambulatory Visit: Payer: Medicare HMO | Attending: Orthopedic Surgery

## 2019-10-02 DIAGNOSIS — R2689 Other abnormalities of gait and mobility: Secondary | ICD-10-CM | POA: Diagnosis present

## 2019-10-02 DIAGNOSIS — M25661 Stiffness of right knee, not elsewhere classified: Secondary | ICD-10-CM | POA: Insufficient documentation

## 2019-10-02 DIAGNOSIS — R262 Difficulty in walking, not elsewhere classified: Secondary | ICD-10-CM | POA: Diagnosis present

## 2019-10-02 DIAGNOSIS — M6281 Muscle weakness (generalized): Secondary | ICD-10-CM | POA: Insufficient documentation

## 2019-10-02 NOTE — Therapy (Signed)
[facility] MAIN [facility] SERVICES [address] [place], [place], [address] Phone: [phone]   Fax:  [phone]  Physical Therapy Evaluation  Patient Details  Name: Virginia Hunt MRN: [id number] Date of Birth: August 12, 1945 Referring Provider (PT): Guerry Bruin MD   Encounter Date: 10/02/2019   PT End of Session - 10/02/19 1502    Visit Number 1    Number of Visits 9    Date for PT Re-Evaluation 10/30/19    Authorization Type 1/10 eval 9/14    PT Start Time 1424    PT Stop Time 1459    PT Time Calculation (min) 35 min    Equipment Utilized During Treatment Gait belt    Activity Tolerance Patient tolerated treatment well    Behavior During Therapy [facility] for tasks assessed/performed           Past Medical History:  Diagnosis Date  . Arthritis   . Carpal tunnel syndrome   . Diabetes mellitus without complication (HCC)    pre diabetes  . Diverticulitis   . Fibromyalgia   . Hypertension     Past Surgical History:  Procedure Laterality Date  . ABDOMINAL HYSTERECTOMY    . CHOLECYSTECTOMY    . TONSILLECTOMY      There were no vitals filed for this visit.    Subjective Assessment - 10/02/19 1450    Subjective Patient presents to PT with ROM and strength impairments secondary to R TKA 06/04/2019.    Pertinent History Pt reports R TKA on Jun 04, 2019, since has been working with HHPT. Pt reports prior to surgery had 'years' of knee pain with cortisone injection therapy for 10-12 years. Pt reports significant limitations in basic mobility. Pt reports having had PT in the past for insidious gait instability, shuffling, LOB. Pt reports this has since resolved. Patient attended OP physical therapy to address strength, ROM and balance impairments with self d/c 09/12/19, despite PT advisory to continue. Patient advised to return to physical therapy to regain ROM in post-operative knee. Patient notes no problem lifting RLE at this point in  rehabilitation process.    Limitations Walking;Lifting;Standing;House hold activities    How long can you sit comfortably? n/a    How long can you stand comfortably? painful    How long can you walk comfortably? no longer requires AD    Currently in Pain? Yes    Pain Score 3     Pain Location Knee    Pain Orientation Right    Pain Descriptors / Indicators Aching    Pain Type Surgical pain    Pain Onset More than a month ago    Pain Frequency Intermittent    Aggravating Factors  n/a    Pain Relieving Factors n/a              [facility] PT Assessment - 10/02/19 0001      Assessment   Medical Diagnosis Rt TKA     Referring Provider (PT) Guerry Bruin MD    Onset Date/Surgical Date 06/04/19    Hand Dominance Right    Prior Therapy OP      Precautions   Precautions None      Restrictions   Weight Bearing Restrictions No      Balance Screen   Has the patient fallen in the past 6 months No    Has the patient had a decrease in activity level because of a fear of falling?  No    Is the  patient reluctant to leave their home because of a fear of falling?  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 No known easing or aggravating factors.  POSTURE: Rounded shoulders,  forward head and thoracic kyphosis in standing and sitting. Midline shift to L in both stance and sitting. Observation:  -R foot: edema and discoloration present  -surgical scar: raised and limited elasticity with cross friction   ROM: Seated AROM: Extension: -18 degrees Flexion: 95 degrees  Supine: Extension AROM with towel under distal ankle: -8 degrees Flexion: 96 degrees AROM, 101 degrees PROM  Accessory mobilizations:  Lateral, sup to inf R patellar mobs hypomobile. Hard end feel AP, PA mobilizations of R tib-fib on femur hypomobile but not painful. Empty end feel    STRENGTH:  Graded on a 0-5 scale Muscle Group Left Right  Hip Flex 4-/5 3+/5  Hip Abd 4-/5 4+/5  Hip Add 4/5 4+/5  Knee Flex 4-/5 5/5  Knee Ext 4+/5 5/5  Ankle DF 4+/5 4+/5  Ankle PF 4+/5 4+/5        NEUROLOGICAL SCREEN: (2+ unless otherwise noted.) N=normal  Ab=abnormal   Level Dermatome R L  L2 Medial thigh/groin N N  L3 Lower thigh/med.knee Ab N  L4 Medial leg/lat thigh Ab N  L5 Lat. leg & dorsal foot N N  S1 post/lat foot/thigh/leg N N  S2 Post./med. thigh & leg N N       SOMATOSENSORY:           Sensation           Intact      Diminished         Absent  Light touch   RLE- medial mid calf to medial malleolus                                FUNCTIONAL MOBILITY: STS: Patient demonstrates increased L lean during task. PT cues for equal weight distribution between BLE. Patient demonstrates minor shift onto RLE with continued predominance of weight over L side.   BALANCE:     Dynamic Sitting Balance  Normal Able to sit unsupported and weight shift across midline maximally    Good Able to sit unsupported and weight shift across midline moderately X  Good-/Fair+ Able to sit unsupported and weight shift across midline minimally    Fair Minimal weight shifting ipsilateral/front, difficulty crossing midline    Fair- Reach to ipsilateral side and unable to weight shift    Poor + Able to sit  unsupported with min A  and reach to ipsilateral side, unable to weight shift    Poor Able to sit unsupported with mod A and reach ipsilateral/front-can't cross midline            Standing Dynamic Balance  Normal Stand independently unsupported, able to weight shift and cross midline maximally    Good Stand independently unsupported, able to weight shift and cross midline moderately    Good-/Fair+ Stand independently unsupported, able to weight shift across midline minimally  X  Fair Stand independently unsupported, weight shift, and reach ipsilaterally, loss of balance when crossing midline   Poor+ Able to stand with Min A and reach ipsilaterally, unable to weight shift    Poor Able to stand with Mod A and minimally reach ipsilaterally, unable to cross midline.                Static Sitting Balance  Normal Able to maintain balance against maximal resistance    Good Able to maintain balance against moderate resistance X  Good-/Fair+ Accepts minimal resistance    Fair Able to sit unsupported without balance loss and without UE support    Poor+ Able to maintain with Minimal assistance from individual or chair    Poor Unable to maintain balance-requires mod/max support from individual or chair            Static Standing Balance  Normal Able to maintain standing balance against maximal resistance    Good Able to maintain standing balance against moderate resistance X  Good-/Fair+ Able to maintain standing balance against minimal resistance    Fair Able to stand unsupported without UE support and without LOB for 1-2 min   Fair- Requires Min A and UE support to maintain standing without loss of balance    Poor+ Requires mod A and UE support to maintain standing without loss of balance    Poor Requires max A and UE support to maintain standing balance without loss            GAIT:  Ambulation limited this session due to patient wearing flip flops. PT educates on wearing sneakers when  participating in therapy for increased safety during tasks. Patient ambulates with L midline shift and decreased arm swing bilaterally. Patient displays midfoot strike and decreased hip/knee flexion bilaterally (with R >L). Decreased stance time on R noted, decrease clearance of R foot.    OUTCOME MEASURES: TEST Outcome Interpretation  5 times sit<>stand 10.28 seconds w/o UE support >66 yo, >15 sec indicates increased risk for falls  10 meter walk test          8.26 seconds = 1.85m/s w/o AD <1.0 m/s indicates increased risk for falls; limited community ambulator  LEFS 52/80 Goal >60  FOTO 61%  Goal predicted score > 74%             Objective measurements completed on examination: See above findings.               PT Education - 10/02/19 1500    Education Details POC, body mechanics, center of mass, footwear    Person(s) Educated Patient    Methods Explanation;Verbal cues    Comprehension Verbalized understanding;Verbal cues required            PT Short Term Goals - 10/02/19 1532      PT SHORT TERM GOAL #1   Title Patient will demonstrate improved R knee extension to -5 degrees in order to promote a more functional gait pattern.    Baseline  9/14: -8 degrees in supine    Time 2    Period Weeks    Status New    Target Date 10/16/19      PT SHORT TERM GOAL #2   Title Patient will demonstrate R knee flexion AROM of ~100 degrees in order to promote a more functional gait pattern.    Baseline 9/14: 96 degrees R knee in supine, 101 degrees PROM    Time 2    Period Weeks    Status New    Target Date 10/16/19      PT SHORT TERM GOAL #3   Title Patient will be independent with HEP to improve RLE strength and mobility in order to perform functional ADLs without pain.    Baseline HEP not administered at evaluation    Time 2    Period Weeks    Status New    Target Date 10/16/19             PT Long Term Goals - 10/02/19 1537      PT LONG TERM GOAL #1    Title Patient will increase FOTO score to equal to or greater than 74 to demonstrate statistically significant improvement in mobility and quality of life.    Baseline 9/14: 61%    Time 4    Period Weeks    Status New    Target Date 10/30/19      PT LONG TERM GOAL #2   Title Patient will increase lower extremity functional scale to >60/80 todemonstrate improved functional mobility and increased tolerance with ADLs.    Baseline 9/14: 52/80    Time 4    Period Weeks    Status New    Target Date 10/30/19      PT LONG TERM GOAL #3   Title Patient will increase 10 meter walk test to >1.65m/s with improved midline awareness to improve gait speed for better community ambulation and to reduce fall risk.    Baseline 9/14: 1.34m/s without AD    Time 4    Period Weeks    Status New    Target Date 10/30/19      PT LONG TERM GOAL #4   Title Patient will increase six minute walk test distance to >1400 with improved midline awareness for progression to community ambulator and improve gait ability    Baseline 9/14: not assessed at evaluation due to patient's footwear    Time 4    Period Weeks    Status New    Target Date 10/30/19      PT LONG TERM GOAL #5   Title Patient will increase Berg Balance score to > 51/56 points to demonstrate decreased fall risk during functional activities.    Baseline 9/14: not assessed at evaluation due to patient's footwear    Time 4    Period Weeks    Status New    Target Date 10/30/19                  Plan - 10/02/19 1523    Clinical Impression Statement Patient is a pleasant 74 y/o female presenting to PT with mobility and strength deficits of the RLE secondary to R TKA 06/04/2019. Evaluation limited due to patient footwear and lack of pain medicine. Patient demonstrates ROM limitations which impacts gait and other functional mobility tasks. Increased edema noted in RLE which affects ROM deficits. Patient is considered a community ambulator at this  time, indicated by gait speed of 1.61m/s. Patient displays  increased reliance on LLE throughout session, mainly evident during sit to stand and gait activities. Patient will benefit from skilled PT to address strength and ROM deficits and reduce pain in order to maximize functional independence at home.    Personal Factors and Comorbidities Age;Comorbidity 1;Time since onset of injury/illness/exacerbation    Comorbidities DM    Examination-Activity Limitations Bed Mobility;Bend;Squat;Locomotion Level;Transfers;Stairs;Stand;Sit    Examination-Participation Restrictions Community Activity;Cleaning;Driving;Shop;Yard Work    Conservation officer, historic buildingstability/Clinical Decision Making Evolving/Moderate complexity    Clinical Decision Making Moderate    Rehab Potential Good    PT Frequency 2x / week    PT Duration 4 weeks    PT Treatment/Interventions ADLs/Self Care Home Management;Cryotherapy;Stair training;Moist Heat;Therapeutic exercise;Balance training;Functional mobility training;Therapeutic activities;Neuromuscular re-education;Patient/family education;Manual techniques;Scar mobilization;Energy conservation;Dry needling;Taping;Manual lymph drainage;Gait training;Compression bandaging;Passive range of motion;Aquatic Therapy    PT Next Visit Plan increase ROM, midline acceptance, RLE strengthening, 6MWT    Consulted and Agree with Plan of Care Patient           Patient will benefit from skilled therapeutic intervention in order to improve the following deficits and impairments:  Abnormal gait, Decreased activity tolerance, Decreased endurance, Decreased range of motion, Decreased strength, Hypomobility, Increased fascial restricitons, Impaired sensation, Pain, Postural dysfunction, Difficulty walking, Decreased mobility, Improper body mechanics, Decreased balance  Visit Diagnosis: Stiffness of right knee, not elsewhere classified  Difficulty in walking, not elsewhere classified  Other abnormalities of gait and  mobility     Problem List There are no problems to display for this patient.  Winfred LeedsKathryn Sabriel Borromeo, SPT  This entire session was performed under direct supervision and direction of a licensed therapist/therapist assistant . I have personally read, edited and approve of the note as written. Precious BardMarina Moser, PT, DPT   10/02/2019, 3:48 PM  [facility]IN [facility] [address] [place], [place], [id number] Phone: [phone]   Fax:  [phone]  Name: Johnna AcostaSaundra Withey MRN: [id number] Date of Birth: 04/24/1945

## 2019-10-08 ENCOUNTER — Ambulatory Visit: Payer: Medicare HMO

## 2019-10-08 ENCOUNTER — Other Ambulatory Visit: Payer: Self-pay

## 2019-10-08 DIAGNOSIS — M6281 Muscle weakness (generalized): Secondary | ICD-10-CM

## 2019-10-08 DIAGNOSIS — R2689 Other abnormalities of gait and mobility: Secondary | ICD-10-CM

## 2019-10-08 DIAGNOSIS — M25661 Stiffness of right knee, not elsewhere classified: Secondary | ICD-10-CM | POA: Diagnosis not present

## 2019-10-08 DIAGNOSIS — R262 Difficulty in walking, not elsewhere classified: Secondary | ICD-10-CM

## 2019-10-08 NOTE — Therapy (Signed)
[place] [facility] MAIN [facility] SERVICES [address] [place], [place], [address] Phone: [phone]   Fax:  [phone]  Physical Therapy Treatment  Patient Details  Name: Virginia Hunt MRN: [id number] Date of Birth: 07-28-1945 Referring Provider (PT): Guerry Bruin MD   Encounter Date: 10/08/2019   PT End of Session - 10/08/19 1442    Visit Number 2    Number of Visits 9    Date for PT Re-Evaluation 10/30/19    Authorization Type 2/10 eval 9/14    PT Start Time 1110    PT Stop Time 1148    PT Time Calculation (min) 38 min    Equipment Utilized During Treatment Gait belt    Activity Tolerance Patient tolerated treatment well    Behavior During Therapy [facility] for tasks assessed/performed           Past Medical History:  Diagnosis Date  . Arthritis   . Carpal tunnel syndrome   . Diabetes mellitus without complication (HCC)    pre diabetes  . Diverticulitis   . Fibromyalgia   . Hypertension     Past Surgical History:  Procedure Laterality Date  . ABDOMINAL HYSTERECTOMY    . CHOLECYSTECTOMY    . TONSILLECTOMY      There were no vitals filed for this visit.   Subjective Assessment - 10/08/19 1440    Subjective Patient reports pain in lateral aspect of leg this session, has been having to stand and walk more frequently.    Pertinent History Pt reports R TKA on Jun 04, 2019, since has been working with HHPT. Pt reports prior to surgery had 'years' of knee pain with cortisone injection therapy for 10-12 years. Pt reports significant limitations in basic mobility. Pt reports having had PT in the past for insidious gait instability, shuffling, LOB. Pt reports this has since resolved. Patient attended OP physical therapy to address strength, ROM and balance impairments with self d/c 09/12/19, despite PT advisory to continue. Patient advised to return to physical therapy to regain ROM in post-operative knee. Patient notes no problem lifting RLE at  this point in rehabilitation process.    Limitations Walking;Lifting;Standing;House hold activities    How long can you sit comfortably? n/a    How long can you stand comfortably? painful    How long can you walk comfortably? no longer requires AD    Patient Stated Goals not sure why I need PT    Currently in Pain? Yes    Pain Score 4     Pain Location Knee    Pain Orientation Right    Pain Descriptors / Indicators Aching    Pain Type Surgical pain;Chronic pain    Pain Onset More than a month ago    Pain Frequency Intermittent                    Manual Tx: -prone hip flexor/knee flexion stretch with overpressure for optimal lengthening and angle 6x 30 second holds, contract relax (quad) with manual stretch 5 x -supine contract relax against PT shoulder 8x 3 second holds with progressive flexion with agonist/antagonist recruitment and lengthening -hamstring lengthening of RLE, 30 seconds with leg on PT shoulder, distraction for pain relief x 2 trials  -IT band rollout RLE 3 minutes for pain reduction   -stand with LLE on 6" step for weight shift 30 second holds  Therex:  Standing next to support surface: -hip flexor lengthening stretch 2x30 seconds -heel taps alternating 10x  to promote heel strike carryover -heel toe rocker 15x each LE placement to promote carryover of gait mechanics with ambulation -ambulate 50 ft with focus on heel strike and toe off: patient has "kick" mechanism of throwing limb forward to propel it  Modified STS press offs (2 inch) clearance with focus on weight shift and hands on knees for propulsion. X10, x 2 trials (added to HEP)  3lb ankle weight:  -kickbacks 15x each LE, cues for gluteal squeeze -seated LAQ 2 second holds x 12 each LE  Pt educated throughout session about proper posture and technique with exercises. Improved exercise technique, movement at target joints, use of target muscles after min to mod verbal, visual, tactile cues                     PT Education - 10/08/19 1441    Education Details exercise technique, body mechanics    Person(s) Educated Patient    Methods Explanation;Demonstration;Tactile cues;Verbal cues    Comprehension Verbalized understanding;Returned demonstration;Verbal cues required            PT Short Term Goals - 10/02/19 1532      PT SHORT TERM GOAL #1   Title Patient will demonstrate improved R knee extension to -5 degrees in order to promote a more functional gait pattern.    Baseline 9/14: -8 degrees in supine    Time 2    Period Weeks    Status New    Target Date 10/16/19      PT SHORT TERM GOAL #2   Title Patient will demonstrate R knee flexion AROM of ~100 degrees in order to promote a more functional gait pattern.    Baseline 9/14: 96 degrees R knee in supine, 101 degrees PROM    Time 2    Period Weeks    Status New    Target Date 10/16/19      PT SHORT TERM GOAL #3   Title Patient will be independent with HEP to improve RLE strength and mobility in order to perform functional ADLs without pain.    Baseline HEP not administered at evaluation    Time 2    Period Weeks    Status New    Target Date 10/16/19             PT Long Term Goals - 10/02/19 1537      PT LONG TERM GOAL #1   Title Patient will increase FOTO score to equal to or greater than 74 to demonstrate statistically significant improvement in mobility and quality of life.    Baseline 9/14: 61%    Time 4    Period Weeks    Status New    Target Date 10/30/19      PT LONG TERM GOAL #2   Title Patient will increase lower extremity functional scale to >60/80 todemonstrate improved functional mobility and increased tolerance with ADLs.    Baseline 9/14: 52/80    Time 4    Period Weeks    Status New    Target Date 10/30/19      PT LONG TERM GOAL #3   Title Patient will increase 10 meter walk test to >1.29m/s with improved midline awareness to improve gait speed for better community  ambulation and to reduce fall risk.    Baseline 9/14: 1.15m/s without AD    Time 4    Period Weeks    Status New    Target Date 10/30/19  PT LONG TERM GOAL #4   Title Patient will increase six minute walk test distance to >1400 with improved midline awareness for progression to community ambulator and improve gait ability    Baseline 9/14: not assessed at evaluation due to patient's footwear    Time 4    Period Weeks    Status New    Target Date 10/30/19      PT LONG TERM GOAL #5   Title Patient will increase Berg Balance score to > 51/56 points to demonstrate decreased fall risk during functional activities.    Baseline 9/14: not assessed at evaluation due to patient's footwear    Time 4    Period Weeks    Status New    Target Date 10/30/19                 Plan - 10/08/19 1443    Clinical Impression Statement Patient presents to physical therapy with increased pain and lateral stiffness and reports decreased pain by end of session. Patient is challenged with weight shift onto post op limb that requires external cueing for neutral alignment. Patient arrived late but was kept late to ensure a full session. Patient will benefit from skilled PT to address strength and ROM deficits and reduce pain in order to maximize functional independence at home.    Personal Factors and Comorbidities Age;Comorbidity 1;Time since onset of injury/illness/exacerbation    Comorbidities DM    Examination-Activity Limitations Bed Mobility;Bend;Squat;Locomotion Level;Transfers;Stairs;Stand;Sit    Examination-Participation Restrictions Community Activity;Cleaning;Driving;Shop;Yard Work    Conservation officer, historic buildings Evolving/Moderate complexity    Rehab Potential Good    PT Frequency 2x / week    PT Duration 4 weeks    PT Treatment/Interventions ADLs/Self Care Home Management;Cryotherapy;Stair training;Moist Heat;Therapeutic exercise;Balance training;Functional mobility  training;Therapeutic activities;Neuromuscular re-education;Patient/family education;Manual techniques;Scar mobilization;Energy conservation;Dry needling;Taping;Manual lymph drainage;Gait training;Compression bandaging;Passive range of motion;Aquatic Therapy    PT Next Visit Plan increase ROM, midline acceptance, RLE strengthening,    Consulted and Agree with Plan of Care Patient           Patient will benefit from skilled therapeutic intervention in order to improve the following deficits and impairments:  Abnormal gait, Decreased activity tolerance, Decreased endurance, Decreased range of motion, Decreased strength, Hypomobility, Increased fascial restricitons, Impaired sensation, Pain, Postural dysfunction, Difficulty walking, Decreased mobility, Improper body mechanics, Decreased balance  Visit Diagnosis: Stiffness of right knee, not elsewhere classified  Other abnormalities of gait and mobility  Muscle weakness (generalized)  Difficulty in walking, not elsewhere classified     Problem List There are no problems to display for this patient.  Precious Bard, PT, DPT   10/08/2019, 2:46 PM  [facility] MAIN [facility] SERVICES [address] [place], [place], [address] Phone: [phone]   Fax:  [phone]  Name: Virginia Hunt MRN: [id number] Date of Birth: 1945/03/07

## 2019-10-10 ENCOUNTER — Ambulatory Visit: Payer: Medicare HMO

## 2019-10-15 ENCOUNTER — Ambulatory Visit: Payer: Medicare HMO

## 2019-10-16 ENCOUNTER — Other Ambulatory Visit: Payer: Self-pay

## 2019-10-16 ENCOUNTER — Ambulatory Visit: Payer: Medicare HMO

## 2019-10-16 DIAGNOSIS — R2689 Other abnormalities of gait and mobility: Secondary | ICD-10-CM

## 2019-10-16 DIAGNOSIS — M25661 Stiffness of right knee, not elsewhere classified: Secondary | ICD-10-CM

## 2019-10-16 DIAGNOSIS — M6281 Muscle weakness (generalized): Secondary | ICD-10-CM

## 2019-10-16 NOTE — Therapy (Signed)
[facility] MAIN [facility] SERVICES [address] [place], [place], [address] Phone: [phone]   Fax:  [phone]  Physical Therapy Treatment  Patient Details  Name: Virginia Hunt MRN: [id number] Date of Birth: [birth year] Referring Provider (PT): Guerry Bruin MD   Encounter Date: 10/16/2019   PT End of Session - 10/16/19 1436    Visit Number 3    Number of Visits 9    Date for PT Re-Evaluation 10/30/19    Authorization Type 3/10 eval 9/14    PT Start Time 1431    PT Stop Time 1513    PT Time Calculation (min) 42 min    Equipment Utilized During Treatment Gait belt    Activity Tolerance Patient tolerated treatment well    Behavior During Therapy [facility] for tasks assessed/performed           Past Medical History:  Diagnosis Date  . Arthritis   . Carpal tunnel syndrome   . Diabetes mellitus without complication (HCC)    pre diabetes  . Diverticulitis   . Fibromyalgia   . Hypertension     Past Surgical History:  Procedure Laterality Date  . ABDOMINAL HYSTERECTOMY    . CHOLECYSTECTOMY    . TONSILLECTOMY      There were no vitals filed for this visit.   Subjective Assessment - 10/16/19 1434    Subjective Patient reports she didn't realize she had a second appointment last week thats why she missed it. Had to reschedule her appointment yesterday to today because of her appointment at the pain clinic.    Pertinent History Pt reports R TKA on Jun 04, 2019, since has been working with HHPT. Pt reports prior to surgery had 'years' of knee pain with cortisone injection therapy for 10-12 years. Pt reports significant limitations in basic mobility. Pt reports having had PT in the past for insidious gait instability, shuffling, LOB. Pt reports this has since resolved. Patient attended OP physical therapy to address strength, ROM and balance impairments with self d/c 09/12/19, despite PT advisory to continue. Patient advised to return to physical  therapy to regain ROM in post-operative knee. Patient notes no problem lifting RLE at this point in rehabilitation process.    Limitations Walking;Lifting;Standing;House hold activities    How long can you sit comfortably? n/a    How long can you stand comfortably? painful    How long can you walk comfortably? no longer requires AD    Patient Stated Goals not sure why I need PT    Currently in Pain? Yes    Pain Score 2     Pain Location Knee    Pain Orientation Right    Pain Descriptors / Indicators Aching    Pain Onset More than a month ago    Pain Frequency Intermittent                  Manual Tx: -prone hip flexor/knee flexion stretch with overpressure for optimal lengthening and angle 6x 30 second holds, contract relax (quad) with manual stretch 5 x -supine contract relax against PT shoulder 8x 3 second holds with progressive flexion with agonist/antagonist recruitment and lengthening -patella mobilization: inferior, superior, medial lateral grade I 10 seconds x 2 trials each direction -hamstring lengthening of RLE, 30 seconds with leg on PT shoulder, distraction for pain relief x 2 trials   -IT band rollout RLE 3 minutes for pain reduction   Therex:  Nustep Lvl 2 RPM >70, seat position 10  Standing next to support surface: Orange hurdle step over 10x each LE, bilateral clearance forward/backwards cues for reduction of circumduction  -hip flexor lengthening stretch 2x30 seconds -heel taps alternating 10x to promote heel strike carryover -heel toe rocker 15x each LE placement to promote carryover of gait mechanics with ambulation -ambulate 50 ft with focus on heel strike and toe off: patient has "kick" mechanism of throwing limb forward to propel it; decreased with cueing and extrinsic combined with intrinsic cueing.        Seated: 4" step hamstring lengthening 30 seconds x 2 trials  4" step heel toe raise seated, challenging for pf surgical limb 15x Sit to stand  with LLE on 6" step for weight shift 30 second hold STS with dynadisc under LLE for weight shift onto RLE : required raised plinth table for performance.   Pt educated throughout session about proper posture and technique with exercises. Improved exercise technique, movement at target joints, use of target muscles after min to mod verbal, visual, tactile cues                     PT Education - 10/16/19 1435    Education Details exercise technique, body mechanics,    Person(s) Educated Patient    Methods Explanation;Demonstration;Tactile cues;Verbal cues    Comprehension Verbalized understanding;Returned demonstration;Verbal cues required;Tactile cues required            PT Short Term Goals - 10/02/19 1532      PT SHORT TERM GOAL #1   Title Patient will demonstrate improved R knee extension to -5 degrees in order to promote a more functional gait pattern.    Baseline 9/14: -8 degrees in supine    Time 2    Period Weeks    Status New    Target Date 10/16/19      PT SHORT TERM GOAL #2   Title Patient will demonstrate R knee flexion AROM of ~100 degrees in order to promote a more functional gait pattern.    Baseline 9/14: 96 degrees R knee in supine, 101 degrees PROM    Time 2    Period Weeks    Status New    Target Date 10/16/19      PT SHORT TERM GOAL #3   Title Patient will be independent with HEP to improve RLE strength and mobility in order to perform functional ADLs without pain.    Baseline HEP not administered at evaluation    Time 2    Period Weeks    Status New    Target Date 10/16/19             PT Long Term Goals - 10/02/19 1537      PT LONG TERM GOAL #1   Title Patient will increase FOTO score to equal to or greater than 74 to demonstrate statistically significant improvement in mobility and quality of life.    Baseline 9/14: 61%    Time 4    Period Weeks    Status New    Target Date 10/30/19      PT LONG TERM GOAL #2   Title Patient  will increase lower extremity functional scale to >60/80 todemonstrate improved functional mobility and increased tolerance with ADLs.    Baseline 9/14: 52/80    Time 4    Period Weeks    Status New    Target Date 10/30/19      PT LONG TERM GOAL #3   Title Patient will  increase 10 meter walk test to >1.56m/s with improved midline awareness to improve gait speed for better community ambulation and to reduce fall risk.    Baseline 9/14: 1.41m/s without AD    Time 4    Period Weeks    Status New    Target Date 10/30/19      PT LONG TERM GOAL #4   Title Patient will increase six minute walk test distance to >1400 with improved midline awareness for progression to community ambulator and improve gait ability    Baseline 9/14: not assessed at evaluation due to patient's footwear    Time 4    Period Weeks    Status New    Target Date 10/30/19      PT LONG TERM GOAL #5   Title Patient will increase Berg Balance score to > 51/56 points to demonstrate decreased fall risk during functional activities.    Baseline 9/14: not assessed at evaluation due to patient's footwear    Time 4    Period Weeks    Status New    Target Date 10/30/19                 Plan - 10/17/19 3825    Clinical Impression Statement Patient reports improved flexibility and control of ambulation with increased weight shift onto surgical limb.  Foot clearance bilaterally is limited due to limited flexion of surgical limb and limited weight acceptance onto surgical limb for opposite LE. Patient will benefit from skilled PT to address strength and ROM deficits and reduce pain in order to maximize functional independence at home    Personal Factors and Comorbidities Age;Comorbidity 1;Time since onset of injury/illness/exacerbation    Comorbidities DM    Examination-Activity Limitations Bed Mobility;Bend;Squat;Locomotion Level;Transfers;Stairs;Stand;Sit    Examination-Participation Restrictions Community  Activity;Cleaning;Driving;Shop;Yard Work    Conservation officer, historic buildings Evolving/Moderate complexity    Rehab Potential Good    PT Frequency 2x / week    PT Duration 4 weeks    PT Treatment/Interventions ADLs/Self Care Home Management;Cryotherapy;Stair training;Moist Heat;Therapeutic exercise;Balance training;Functional mobility training;Therapeutic activities;Neuromuscular re-education;Patient/family education;Manual techniques;Scar mobilization;Energy conservation;Dry needling;Taping;Manual lymph drainage;Gait training;Compression bandaging;Passive range of motion;Aquatic Therapy    PT Next Visit Plan increase ROM, midline acceptance, RLE strengthening,    Consulted and Agree with Plan of Care Patient           Patient will benefit from skilled therapeutic intervention in order to improve the following deficits and impairments:  Abnormal gait, Decreased activity tolerance, Decreased endurance, Decreased range of motion, Decreased strength, Hypomobility, Increased fascial restricitons, Impaired sensation, Pain, Postural dysfunction, Difficulty walking, Decreased mobility, Improper body mechanics, Decreased balance  Visit Diagnosis: Stiffness of right knee, not elsewhere classified  Other abnormalities of gait and mobility  Muscle weakness (generalized)     Problem List There are no problems to display for this patient.  Precious Bard, PT, DPT   10/17/2019, 8:19 AM  [facility] MAIN [facility] SERVICES [address] [place], [place], [address] Phone: [phone]   Fax:  [phone]  Name: Virginia Hunt MRN: [id number] Date of Birth: [birth year]

## 2019-10-22 ENCOUNTER — Other Ambulatory Visit: Payer: Self-pay

## 2019-10-22 ENCOUNTER — Ambulatory Visit: Payer: Medicare HMO | Attending: Orthopedic Surgery

## 2019-10-22 DIAGNOSIS — R262 Difficulty in walking, not elsewhere classified: Secondary | ICD-10-CM | POA: Diagnosis present

## 2019-10-22 DIAGNOSIS — R2689 Other abnormalities of gait and mobility: Secondary | ICD-10-CM | POA: Diagnosis present

## 2019-10-22 DIAGNOSIS — M25661 Stiffness of right knee, not elsewhere classified: Secondary | ICD-10-CM | POA: Insufficient documentation

## 2019-10-22 DIAGNOSIS — M6281 Muscle weakness (generalized): Secondary | ICD-10-CM | POA: Diagnosis present

## 2019-10-22 NOTE — Therapy (Signed)
[facility] MAIN [facility] SERVICES [address] [place], [place], [address] Phone: [phone]   Fax:  [phone]  Physical Therapy Treatment  Patient Details  Name: Virginia Hunt MRN: [id number] Date of Birth: 03/21/1945 Referring Provider (PT): Guerry Bruin MD   Encounter Date: 10/22/2019   PT End of Session - 10/22/19 1936    Visit Number 4    Number of Visits 9    Date for PT Re-Evaluation 10/30/19    Authorization Type 4/10 eval 9/14    PT Start Time 1111    PT Stop Time 1145    PT Time Calculation (min) 34 min    Equipment Utilized During Treatment Gait belt    Activity Tolerance Patient tolerated treatment well    Behavior During Therapy [facility] for tasks assessed/performed           Past Medical History:  Diagnosis Date  . Arthritis   . Carpal tunnel syndrome   . Diabetes mellitus without complication (HCC)    pre diabetes  . Diverticulitis   . Fibromyalgia   . Hypertension     Past Surgical History:  Procedure Laterality Date  . ABDOMINAL HYSTERECTOMY    . CHOLECYSTECTOMY    . TONSILLECTOMY      There were no vitals filed for this visit.   Subjective Assessment - 10/22/19 1115    Subjective Patient reports her pain was better after last session but worsened over the weekend, is painful again today with foot swelling bilaterally and "burning" on inside of knee    Pertinent History Pt reports R TKA on Jun 04, 2019, since has been working with HHPT. Pt reports prior to surgery had 'years' of knee pain with cortisone injection therapy for 10-12 years. Pt reports significant limitations in basic mobility. Pt reports having had PT in the past for insidious gait instability, shuffling, LOB. Pt reports this has since resolved. Patient attended OP physical therapy to address strength, ROM and balance impairments with self d/c 09/12/19, despite PT advisory to continue. Patient advised to return to physical therapy to regain ROM in  post-operative knee. Patient notes no problem lifting RLE at this point in rehabilitation process.    Limitations Walking;Lifting;Standing;House hold activities    How long can you sit comfortably? n/a    How long can you stand comfortably? painful    How long can you walk comfortably? no longer requires AD    Patient Stated Goals not sure why I need PT    Currently in Pain? Yes    Pain Score 7     Pain Location Knee    Pain Orientation Right    Pain Descriptors / Indicators Aching;Sore;Burning    Pain Type Surgical pain;Neuropathic pain    Pain Onset More than a month ago    Pain Frequency Intermittent                  Patient's session is limited due to late arrival.       Manual Tx: -prone hip flexor/knee flexion stretch with overpressure for optimal lengthening and angle 6x 30 second holds, contract relax (quad) with manual stretch 5 x -patella mobilization: inferior, superior, medial lateral grade I 10 seconds x 2 trials each direction -hamstring lengthening of RLE, 30 seconds with leg on PT shoulder, distraction for pain relief       Therex:  Nustep Lvl 3 RPM >70, seat position 10; 3 minutes    Standing next to support surface: [employer]  hurdle step over with focus on heel strike 10x each LE, bilateral clearance forward/backwards cues for reduction of circumduction  --heel taps alternating 10x to promote heel strike carryover -heel toe rocker 15x each LE placement to promote carryover of gait mechanics with ambulation -ambulate 50 ft with focus on heel strike and toe off: patient has "kick" mechanism of throwing limb forward to propel it; decreased with cueing and extrinsic combined with intrinsic cueing.   stair calf stretch 2x30 second holds each LE,     Prone: Hamstring curl RLE 10x 3 second  Donkey kick RLE 10x      Seated: Straight leg abduction/adduction RLE 10x  Sit to stand with LLE on 6" step for weight shift 30 second hold STS with dynadisc under LLE  for weight shift onto RLE : required raised plinth table for performance.    Pt educated throughout session about proper posture and technique with exercises. Improved exercise technique, movement at target joints, use of target muscles after min to mod verbal, visual, tactile cues                  PT Education - 10/22/19 1935    Education Details exercise technique, manual, weight shift, heel strike    Person(s) Educated Patient    Methods Explanation;Demonstration;Tactile cues;Verbal cues    Comprehension Verbalized understanding;Returned demonstration;Verbal cues required;Tactile cues required            PT Short Term Goals - 10/02/19 1532      PT SHORT TERM GOAL #1   Title Patient will demonstrate improved R knee extension to -5 degrees in order to promote a more functional gait pattern.    Baseline 9/14: -8 degrees in supine    Time 2    Period Weeks    Status New    Target Date 10/16/19      PT SHORT TERM GOAL #2   Title Patient will demonstrate R knee flexion AROM of ~100 degrees in order to promote a more functional gait pattern.    Baseline 9/14: 96 degrees R knee in supine, 101 degrees PROM    Time 2    Period Weeks    Status New    Target Date 10/16/19      PT SHORT TERM GOAL #3   Title Patient will be independent with HEP to improve RLE strength and mobility in order to perform functional ADLs without pain.    Baseline HEP not administered at evaluation    Time 2    Period Weeks    Status New    Target Date 10/16/19             PT Long Term Goals - 10/02/19 1537      PT LONG TERM GOAL #1   Title Patient will increase FOTO score to equal to or greater than 74 to demonstrate statistically significant improvement in mobility and quality of life.    Baseline 9/14: 61%    Time 4    Period Weeks    Status New    Target Date 10/30/19      PT LONG TERM GOAL #2   Title Patient will increase lower extremity functional scale to >60/80  todemonstrate improved functional mobility and increased tolerance with ADLs.    Baseline 9/14: 52/80    Time 4    Period Weeks    Status New    Target Date 10/30/19      PT LONG TERM GOAL #3   Title  Patient will increase 10 meter walk test to >1.38m/s with improved midline awareness to improve gait speed for better community ambulation and to reduce fall risk.    Baseline 9/14: 1.35m/s without AD    Time 4    Period Weeks    Status New    Target Date 10/30/19      PT LONG TERM GOAL #4   Title Patient will increase six minute walk test distance to >1400 with improved midline awareness for progression to community ambulator and improve gait ability    Baseline 9/14: not assessed at evaluation due to patient's footwear    Time 4    Period Weeks    Status New    Target Date 10/30/19      PT LONG TERM GOAL #5   Title Patient will increase Berg Balance score to > 51/56 points to demonstrate decreased fall risk during functional activities.    Baseline 9/14: not assessed at evaluation due to patient's footwear    Time 4    Period Weeks    Status New    Target Date 10/30/19                 Plan - 10/22/19 1940    Clinical Impression Statement Patient's session limited by late arrival. Continued focus on weight shift and acceptance with decreased need for external cues performed. Patient reports reduction of pain by end of session. Heel strike continues to be challenging for patient to perform however is improved after calf stretch interventions. Patient will benefit from skilled PT to address strength and ROM deficits and reduce pain in order to maximize functional independence at home    Personal Factors and Comorbidities Age;Comorbidity 1;Time since onset of injury/illness/exacerbation    Comorbidities DM    Examination-Activity Limitations Bed Mobility;Bend;Squat;Locomotion Level;Transfers;Stairs;Stand;Sit    Examination-Participation Restrictions Community  Activity;Cleaning;Driving;Shop;Yard Work    Conservation officer, historic buildings Evolving/Moderate complexity    Rehab Potential Good    PT Frequency 2x / week    PT Duration 4 weeks    PT Treatment/Interventions ADLs/Self Care Home Management;Cryotherapy;Stair training;Moist Heat;Therapeutic exercise;Balance training;Functional mobility training;Therapeutic activities;Neuromuscular re-education;Patient/family education;Manual techniques;Scar mobilization;Energy conservation;Dry needling;Taping;Manual lymph drainage;Gait training;Compression bandaging;Passive range of motion;Aquatic Therapy    PT Next Visit Plan increase ROM, midline acceptance, RLE strengthening,    Consulted and Agree with Plan of Care Patient           Patient will benefit from skilled therapeutic intervention in order to improve the following deficits and impairments:  Abnormal gait, Decreased activity tolerance, Decreased endurance, Decreased range of motion, Decreased strength, Hypomobility, Increased fascial restricitons, Impaired sensation, Pain, Postural dysfunction, Difficulty walking, Decreased mobility, Improper body mechanics, Decreased balance  Visit Diagnosis: Stiffness of right knee, not elsewhere classified  Other abnormalities of gait and mobility  Muscle weakness (generalized)     Problem List There are no problems to display for this patient.  Precious Bard, PT, DPT   10/22/2019, 7:42 PM  [place] [facility] MAIN [facility] SERVICES [address] [place], [place], [address] Phone: [phone]   Fax:  [phone]  Name: Yvana Samonte MRN: [id number] Date of Birth: 08-23-1945

## 2019-10-24 ENCOUNTER — Other Ambulatory Visit: Payer: Self-pay

## 2019-10-24 ENCOUNTER — Ambulatory Visit: Payer: Medicare HMO

## 2019-10-24 DIAGNOSIS — M6281 Muscle weakness (generalized): Secondary | ICD-10-CM

## 2019-10-24 DIAGNOSIS — M25661 Stiffness of right knee, not elsewhere classified: Secondary | ICD-10-CM

## 2019-10-24 DIAGNOSIS — R2689 Other abnormalities of gait and mobility: Secondary | ICD-10-CM

## 2019-10-24 DIAGNOSIS — R262 Difficulty in walking, not elsewhere classified: Secondary | ICD-10-CM

## 2019-10-24 NOTE — Therapy (Signed)
[place] MAIN [facility] SERVICES [address] [place], [place], [address] Phone: [phone]   Fax:  [phone]  Physical Therapy Treatment  Patient Details  Name: Virginia Hunt MRN: [id number] Date of Birth: 1945-03-01 Referring Provider (PT): Guerry Bruin MD   Encounter Date: 10/24/2019   PT End of Session - 10/24/19 1535    Visit Number 5    Number of Visits 9    Date for PT Re-Evaluation 10/30/19    Authorization Type 5/10 eval 9/14    PT Start Time 1347    PT Stop Time 1429    PT Time Calculation (min) 42 min    Equipment Utilized During Treatment Gait belt    Activity Tolerance Patient tolerated treatment well    Behavior During Therapy [facility] for tasks assessed/performed           Past Medical History:  Diagnosis Date  . Arthritis   . Carpal tunnel syndrome   . Diabetes mellitus without complication (HCC)    pre diabetes  . Diverticulitis   . Fibromyalgia   . Hypertension     Past Surgical History:  Procedure Laterality Date  . ABDOMINAL HYSTERECTOMY    . CHOLECYSTECTOMY    . TONSILLECTOMY      There were no vitals filed for this visit.   Subjective Assessment - 10/24/19 1533    Subjective Patient reports compliance with HEP, reports pain is less today but continues to have a hard time with straightening her leg.    Pertinent History Pt reports R TKA on Jun 04, 2019, since has been working with HHPT. Pt reports prior to surgery had 'years' of knee pain with cortisone injection therapy for 10-12 years. Pt reports significant limitations in basic mobility. Pt reports having had PT in the past for insidious gait instability, shuffling, LOB. Pt reports this has since resolved. Patient attended OP physical therapy to address strength, ROM and balance impairments with self d/c 09/12/19, despite PT advisory to continue. Patient advised to return to physical therapy to regain ROM in post-operative knee. Patient notes no problem  lifting RLE at this point in rehabilitation process.    Limitations Walking;Lifting;Standing;House hold activities    How long can you sit comfortably? n/a    How long can you stand comfortably? painful    How long can you walk comfortably? no longer requires AD    Patient Stated Goals not sure why I need PT    Currently in Pain? Yes    Pain Score 3     Pain Location Knee    Pain Orientation Right    Pain Descriptors / Indicators Aching;Stabbing    Pain Type Chronic pain;Surgical pain    Pain Onset More than a month ago    Pain Frequency Intermittent           Manual:   -prone hip flexor/knee flexion stretch with overpressure for optimal lengthening and angle 6x 30 second holds, contract relax (quad) with manual stretch 5 x -patella mobilization: inferior, superior, medial lateral grade I 10 seconds x 2 trials each direction -hamstring lengthening of RLE, 30 seconds with leg on PT shoulder, distraction for pain relief   -roller to R hamstring 3 minutes.      Therex:   Standing next to support surface: 4" step lateral step up/down with BUE support 12x each side 4" step forward step up with crane lunge SUE support 10x each side, challenging to stabilize on single limb occasionally 3 way  hip raise 10x each LE Heel taps 12x each LE, progressed to standing hamstring lengthening   -ambulate 50 ft with focus on heel strike and toe off: patient has "kick" mechanism of throwing limb forward to propel it; decreased with cueing and extrinsic combined with intrinsic cueing.       Prone: Hamstring curl RLE 15x 3 second ; 2lb weight; 2 sets       Seated: RLE single LE sit to stand from raised plinth table with BUE support 15x, very challenging to patient with heavy reliance upon UE's. RTB around feet: df with march 12x each side RTB around ankles, green ball between knees alternating IR/ER 12x each LE Bicycle RLE 12x ; very fatiguing  Sit to stand 10x with focus on equal weight shift  and eccentric control.    Pt educated throughout session about proper posture and technique with exercises. Improved exercise technique, movement at target joints, use of target muscles after min to mod verbal, visual, tactile cues                           PT Education - 10/24/19 1534    Education Details exercise technique, manual, weight shift    Person(s) Educated Patient    Methods Explanation;Demonstration;Tactile cues;Verbal cues    Comprehension Verbalized understanding;Returned demonstration;Verbal cues required;Tactile cues required            PT Short Term Goals - 10/02/19 1532      PT SHORT TERM GOAL #1   Title Patient will demonstrate improved R knee extension to -5 degrees in order to promote a more functional gait pattern.    Baseline 9/14: -8 degrees in supine    Time 2    Period Weeks    Status New    Target Date 10/16/19      PT SHORT TERM GOAL #2   Title Patient will demonstrate R knee flexion AROM of ~100 degrees in order to promote a more functional gait pattern.    Baseline 9/14: 96 degrees R knee in supine, 101 degrees PROM    Time 2    Period Weeks    Status New    Target Date 10/16/19      PT SHORT TERM GOAL #3   Title Patient will be independent with HEP to improve RLE strength and mobility in order to perform functional ADLs without pain.    Baseline HEP not administered at evaluation    Time 2    Period Weeks    Status New    Target Date 10/16/19             PT Long Term Goals - 10/02/19 1537      PT LONG TERM GOAL #1   Title Patient will increase FOTO score to equal to or greater than 74 to demonstrate statistically significant improvement in mobility and quality of life.    Baseline 9/14: 61%    Time 4    Period Weeks    Status New    Target Date 10/30/19      PT LONG TERM GOAL #2   Title Patient will increase lower extremity functional scale to >60/80 todemonstrate improved functional mobility and increased  tolerance with ADLs.    Baseline 9/14: 52/80    Time 4    Period Weeks    Status New    Target Date 10/30/19      PT LONG TERM GOAL #3   Title  Patient will increase 10 meter walk test to >1.42m/s with improved midline awareness to improve gait speed for better community ambulation and to reduce fall risk.    Baseline 9/14: 1.46m/s without AD    Time 4    Period Weeks    Status New    Target Date 10/30/19      PT LONG TERM GOAL #4   Title Patient will increase six minute walk test distance to >1400 with improved midline awareness for progression to community ambulator and improve gait ability    Baseline 9/14: not assessed at evaluation due to patient's footwear    Time 4    Period Weeks    Status New    Target Date 10/30/19      PT LONG TERM GOAL #5   Title Patient will increase Berg Balance score to > 51/56 points to demonstrate decreased fall risk during functional activities.    Baseline 9/14: not assessed at evaluation due to patient's footwear    Time 4    Period Weeks    Status New    Target Date 10/30/19                 Plan - 10/24/19 1538    Clinical Impression Statement Patient is progressing with functional strengthening and muscle tissue length with decreased need for cueing for weight shift as well. Patient introduced to single limb sit to stand and is very challenged however able to tolerate with raised chair position and UE support. By end of session patient demonstrates improved knee extension and heel strike with gait mechanics. Patient will benefit from skilled PT to address strength and ROM deficits and reduce pain in order to maximize functional independence at home    Personal Factors and Comorbidities Age;Comorbidity 1;Time since onset of injury/illness/exacerbation    Comorbidities DM    Examination-Activity Limitations Bed Mobility;Bend;Squat;Locomotion Level;Transfers;Stairs;Stand;Sit    Examination-Participation Restrictions Community  Activity;Cleaning;Driving;Shop;Yard Work    Conservation officer, historic buildings Evolving/Moderate complexity    Rehab Potential Good    PT Frequency 2x / week    PT Duration 4 weeks    PT Treatment/Interventions ADLs/Self Care Home Management;Cryotherapy;Stair training;Moist Heat;Therapeutic exercise;Balance training;Functional mobility training;Therapeutic activities;Neuromuscular re-education;Patient/family education;Manual techniques;Scar mobilization;Energy conservation;Dry needling;Taping;Manual lymph drainage;Gait training;Compression bandaging;Passive range of motion;Aquatic Therapy    PT Next Visit Plan increase ROM, midline acceptance, RLE strengthening,    Consulted and Agree with Plan of Care Patient           Patient will benefit from skilled therapeutic intervention in order to improve the following deficits and impairments:  Abnormal gait, Decreased activity tolerance, Decreased endurance, Decreased range of motion, Decreased strength, Hypomobility, Increased fascial restricitons, Impaired sensation, Pain, Postural dysfunction, Difficulty walking, Decreased mobility, Improper body mechanics, Decreased balance  Visit Diagnosis: Stiffness of right knee, not elsewhere classified  Other abnormalities of gait and mobility  Muscle weakness (generalized)  Difficulty in walking, not elsewhere classified     Problem List There are no problems to display for this patient.  Precious Bard, PT, DPT   10/24/2019, 3:40 PM  [place] [facility] MAIN [facility] SERVICES [address] [place], [place], [address] Phone: [phone]   Fax:  [phone]  Name: Virginia Hunt MRN: [id number] Date of Birth: 10-23-1945

## 2019-10-29 ENCOUNTER — Ambulatory Visit: Payer: Medicare HMO

## 2019-10-29 ENCOUNTER — Ambulatory Visit: Payer: No Typology Code available for payment source | Attending: Internal Medicine

## 2019-10-29 DIAGNOSIS — Z23 Encounter for immunization: Secondary | ICD-10-CM

## 2019-10-29 NOTE — Progress Notes (Signed)
° °  Covid-19 Vaccination Clinic  Name:  Tyshika Baldridge    MRN: [id number] DOB: 03-15-1945  10/29/2019  Ms. Kuipers was observed post Covid-19 immunization for 15 minutes without incident. She was provided with Vaccine Information Sheet and instruction to access the V-Safe system.   Ms. Sigmund was instructed to call 911 with any severe reactions post vaccine:  Difficulty breathing   Swelling of face and throat   A fast heartbeat   A bad rash all over body   Dizziness and weakness

## 2019-10-31 ENCOUNTER — Ambulatory Visit: Payer: Medicare HMO

## 2019-11-01 ENCOUNTER — Other Ambulatory Visit: Payer: Self-pay

## 2019-11-01 ENCOUNTER — Ambulatory Visit: Payer: Medicare HMO

## 2019-11-01 DIAGNOSIS — M25661 Stiffness of right knee, not elsewhere classified: Secondary | ICD-10-CM | POA: Diagnosis not present

## 2019-11-01 DIAGNOSIS — R2689 Other abnormalities of gait and mobility: Secondary | ICD-10-CM

## 2019-11-01 DIAGNOSIS — M6281 Muscle weakness (generalized): Secondary | ICD-10-CM

## 2019-11-01 NOTE — Therapy (Signed)
[place] MAIN [facility] SERVICES [address] [place], [place], [address] Phone: [phone]   Fax:  [phone]  Physical Therapy Treatment/RECERT  Patient Details  Name: Virginia Hunt MRN: [id number] Date of Birth: August 11, 1945 Referring Provider (PT): Kizzie Bane MD   Encounter Date: 11/01/2019   PT End of Session - 11/01/19 1625    Visit Number 6    Number of Visits 14    Date for PT Re-Evaluation 11/29/19    Authorization Type 6/10 eval 9/14    PT Start Time 1515    PT Stop Time 1559    PT Time Calculation (min) 44 min    Equipment Utilized During Treatment Gait belt    Activity Tolerance Patient tolerated treatment well    Behavior During Therapy [facility] for tasks assessed/performed           Past Medical History:  Diagnosis Date  . Arthritis   . Carpal tunnel syndrome   . Diabetes mellitus without complication ([place])    pre diabetes  . Diverticulitis   . Fibromyalgia   . Hypertension     Past Surgical History:  Procedure Laterality Date  . ABDOMINAL HYSTERECTOMY    . CHOLECYSTECTOMY    . TONSILLECTOMY      There were no vitals filed for this visit.   Subjective Assessment - 11/01/19 1521    Subjective Patient has not been seen in over a week, reports she worked last night and is feeling stiff.    Pertinent History Pt reports R TKA on Jun 04, 2019, since has been working with [facility]. Pt reports prior to surgery had 'years' of knee pain with cortisone injection therapy for 10-12 years. Pt reports significant limitations in basic mobility. Pt reports having had PT in the past for insidious gait instability, shuffling, LOB. Pt reports this has since resolved. Patient attended OP physical therapy to address strength, ROM and balance impairments with self d/c 09/12/19, despite PT advisory to continue. Patient advised to return to physical therapy to regain ROM in post-operative knee. Patient notes no problem lifting RLE at this point  in rehabilitation process.    Limitations Walking;Lifting;Standing;House hold activities    How long can you sit comfortably? n/a    How long can you stand comfortably? painful    How long can you walk comfortably? no longer requires AD    Patient Stated Goals not sure why I need PT    Currently in Pain? Yes    Pain Score 3     Pain Location Knee    Pain Orientation Right    Pain Descriptors / Indicators Aching    Pain Type Chronic pain;Surgical pain    Pain Onset More than a month ago    Pain Frequency Intermittent              [facility] PT Assessment - 11/01/19 0001      Standardized Balance Assessment   Standardized Balance Assessment Berg Balance Test      Berg Balance Test   Sit to Stand Able to stand without using hands and stabilize independently    Standing Unsupported Able to stand safely 2 minutes    Sitting with Back Unsupported but Feet Supported on Floor or Stool Able to sit safely and securely 2 minutes    Stand to Sit Sits safely with minimal use of hands    Transfers Able to transfer safely, minor use of hands    Standing Unsupported with Eyes Closed Able  to stand 10 seconds safely    Standing Unsupported with Feet Together Able to place feet together independently and stand 1 minute safely    From Standing, Reach Forward with Outstretched Arm Can reach forward >12 cm safely (5")    From Standing Position, Pick up Object from [place] to pick up shoe safely and easily    From Standing Position, Turn to Look Behind Over each Shoulder Looks behind from both sides and weight shifts well    Turn 360 Degrees Able to turn 360 degrees safely one side only in 4 seconds or less    Standing Unsupported, Alternately Place Feet on Step/Stool Able to stand independently and complete 8 steps >20 seconds    Standing Unsupported, One Foot in Front Able to plae foot ahead of the other independently and hold 30 seconds    Standing on One Leg Able to lift leg independently and hold  equal to or more than 3 seconds    Total Score 50             Goals:   R knee extension seated AROM -7 , PROM supine -7 R knee flexion: seated AROM 95, PROM supine 97  FOTO 59/100 LEFS: 57/80  10 MWT: fast walk 7 seconds (1.4 m/s) , average pace 13 seconds  6 MWT: 1230  BERG: 50/56   manual: -supine contract relax against PT shoulder 8x 10 second holds with progressive flexion with agonist/antagonist recruitment and lengthening -patella mobilizations: superior, inferior, medial, lateral grade II 10 seconds x 2 trials each direction -tib fib mobilization anterior/posterior with rotation component                 PT Education - 11/01/19 1524    Education Details goals, compliance with attendance    Person(s) Educated Patient    Methods Explanation;Demonstration;Tactile cues;Verbal cues    Comprehension Verbalized understanding;Returned demonstration;Verbal cues required;Tactile cues required            PT Short Term Goals - 11/01/19 1624      PT SHORT TERM GOAL #1   Title Patient will demonstrate improved R knee extension to -5 degrees in order to promote a more functional gait pattern.    Baseline 9/14: -8 degrees in supine 10/14: AROM -7 , PROM supine -7    Time 2    Period Weeks    Status On-going    Target Date 11/15/19      PT SHORT TERM GOAL #2   Title Patient will demonstrate R knee flexion AROM of ~100 degrees in order to promote a more functional gait pattern.    Baseline 9/14: 96 degrees R knee in supine, 101 degrees PROM 10/14: seated AROM 95, PROM supine 97    Time 2    Period Weeks    Status On-going    Target Date 11/15/19      PT SHORT TERM GOAL #3   Title Patient will be independent with HEP to improve RLE strength and mobility in order to perform functional ADLs without pain.    Baseline HEP not administered at evaluation 10/14: intermittent compliance    Time 2    Period Weeks    Status On-going    Target Date 11/15/19              PT Long Term Goals - 11/01/19 1609      PT LONG TERM GOAL #1   Title Patient will increase FOTO score to equal to or greater than  74 to demonstrate statistically significant improvement in mobility and quality of life.    Baseline 9/14: 61% 10/14: 59    Time 4    Period Weeks    Status On-going    Target Date 11/29/19      PT LONG TERM GOAL #2   Title Patient will increase lower extremity functional scale to >60/80 todemonstrate improved functional mobility and increased tolerance with ADLs.    Baseline 9/14: 52/80 10/14: 57/80    Time 4    Period Weeks    Status Partially Met    Target Date 11/29/19      PT LONG TERM GOAL #3   Title Patient will increase 10 meter walk test to >1.26ms with improved midline awareness to improve gait speed for better community ambulation and to reduce fall risk.    Baseline 9/14: 1.223m without AD 10/14: 1.4 m/s    Time 4    Period Weeks    Status Achieved      PT LONG TERM GOAL #4   Title Patient will increase six minute walk test distance to >1400 with improved midline awareness for progression to community ambulator and improve gait ability    Baseline 9/14: not assessed at evaluation due to patient's footwear 10/14: 1230 ft    Time 4    Period Weeks    Status Partially Met    Target Date 11/29/19      PT LONG TERM GOAL #5   Title Patient will increase Berg Balance score to > 51/56 points to demonstrate decreased fall risk during functional activities.    Baseline 9/14: not assessed at evaluation due to patient's footwear; 10/14: 50/56    Time 4    Period Weeks    Status Partially Met    Target Date 11/29/19                 Plan - 11/01/19 1628    Clinical Impression Statement Patient is making progress towards ambulatory and stability goals however had regressed in ROM goals. Patient reports the knee in general is doing better but has a dull ache, the medial nerve pain is reducing, the lateral nerve pain continues to be  present. Is not showing up to about half her appointments. Educated on need for compliance with attending sessions for progressions. A trial period of a recert will benefit patient at this time with patient verbalizing understanding of need for compliance to HEP and attending sessions for continuation. Patient will benefit from skilled PT to address strength and ROM deficits and reduce pain in order to maximize functional independence at home    Personal Factors and Comorbidities Age;Comorbidity 1;Time since onset of injury/illness/exacerbation    Comorbidities DM    Examination-Activity Limitations Bed Mobility;Bend;Squat;Locomotion Level;Transfers;Stairs;Stand;Sit    Examination-Participation Restrictions Community Activity;Cleaning;Driving;Shop;Yard Work    [employer] officervolving/Moderate complexity    Rehab Potential Good    PT Frequency 2x / week    PT Duration 4 weeks    PT Treatment/Interventions ADLs/Self Care Home Management;Cryotherapy;Stair training;Moist Heat;Therapeutic exercise;Balance training;Functional mobility training;Therapeutic activities;Neuromuscular re-education;Patient/family education;Manual techniques;Scar mobilization;Energy conservation;Dry needling;Taping;Manual lymph drainage;Gait training;Compression bandaging;Passive range of motion;Aquatic Therapy    PT Next Visit Plan increase ROM, midline acceptance, RLE strengthening, 6MWT    Consulted and Agree with Plan of Care Patient           Patient will benefit from skilled therapeutic intervention in order to improve the following deficits and impairments:  Abnormal gait, Decreased activity tolerance, Decreased endurance,  Decreased range of motion, Decreased strength, Hypomobility, Increased fascial restricitons, Impaired sensation, Pain, Postural dysfunction, Difficulty walking, Decreased mobility, Improper body mechanics, Decreased balance  Visit Diagnosis: Stiffness of right knee, not elsewhere  classified  Other abnormalities of gait and mobility  Muscle weakness (generalized)     Problem List There are no problems to display for this patient.  Janna Arch, PT, DPT   11/01/2019, 4:30 PM  [place] MAIN [facility] SERVICES [address] [place], [place], [address] Phone: [phone]   Fax:  [phone]  Name: Virginia Hunt MRN: [id number] Date of Birth: 1945-10-18

## 2019-11-05 ENCOUNTER — Other Ambulatory Visit: Payer: Self-pay

## 2019-11-05 ENCOUNTER — Ambulatory Visit: Payer: Medicare HMO

## 2019-11-05 DIAGNOSIS — M6281 Muscle weakness (generalized): Secondary | ICD-10-CM

## 2019-11-05 DIAGNOSIS — M25661 Stiffness of right knee, not elsewhere classified: Secondary | ICD-10-CM | POA: Diagnosis not present

## 2019-11-05 DIAGNOSIS — R2689 Other abnormalities of gait and mobility: Secondary | ICD-10-CM

## 2019-11-05 DIAGNOSIS — R262 Difficulty in walking, not elsewhere classified: Secondary | ICD-10-CM

## 2019-11-05 NOTE — Therapy (Signed)
[place] MAIN [facility] SERVICES [address] [place], [place], [address] Phone: [phone]   Fax:  [phone]  Physical Therapy Treatment  Patient Details  Name: Virginia Hunt MRN: [id number] Date of Birth: May 30, 1945 Referring Provider (PT): Kizzie Bane MD   Encounter Date: 11/05/2019   PT End of Session - 11/05/19 1405    Visit Number 7    Number of Visits 14    Date for PT Re-Evaluation 11/29/19    PT Start Time 3382    PT Stop Time 1425    PT Time Calculation (min) 30 min    Equipment Utilized During Treatment Gait belt    Activity Tolerance Patient tolerated treatment well    Behavior During Therapy [facility] for tasks assessed/performed           Past Medical History:  Diagnosis Date  . Arthritis   . Carpal tunnel syndrome   . Diabetes mellitus without complication ([place])    pre diabetes  . Diverticulitis   . Fibromyalgia   . Hypertension     Past Surgical History:  Procedure Laterality Date  . ABDOMINAL HYSTERECTOMY    . CHOLECYSTECTOMY    . TONSILLECTOMY      There were no vitals filed for this visit.   Subjective Assessment - 11/05/19 1404    Subjective Pt doing fine today. Reports to still have worse pain at night. Pt has bene working on HEP slowly.    Pertinent History Pt reports R TKA on Jun 04, 2019, since has been working with [place]. Pt reports prior to surgery had 'years' of knee pain with cortisone injection therapy for 10-12 years. Pt reports significant limitations in basic mobility. Pt reports having had PT in the past for insidious gait instability, shuffling, LOB. Pt reports this has since resolved. Patient attended OP physical therapy to address strength, ROM and balance impairments with self d/c 09/12/19, despite PT advisory to continue. Patient advised to return to physical therapy to regain ROM in post-operative knee. Patient notes no problem lifting RLE at this point in rehabilitation process.     Limitations Walking;Lifting;Standing;House hold activities    Currently in Pain? Yes    Pain Score 7     Pain Orientation Right           INTERVENTION THIS DATE   -Overground AMB 758f  -Seated heel slides x2 minutes flexion stretch, extension stretch  -seated knee flexion stretch 3x30sec (very painful for patient)  -STS from chair + airex, hands free 2x10 (supervision level)  -Heel raises BUE on bar, 2x20 (cues for max height)  -Standing hamstrings curl Rt only 1x15, 1x15 c 3lb AW  -Seated LAQ 2x15x3secH (3lb AW)  -Standing Rt Hip ABDCT 1x10 c 3lb AW (DC'd 2/2 Left hip pain exacerbation)  -Right SLS 20x1-2sec (pt quite anxious, educated on performance in context of weight shifting)       PT Short Term Goals - 11/01/19 1624      PT SHORT TERM GOAL #1   Title Patient will demonstrate improved R knee extension to -5 degrees in order to promote a more functional gait pattern.    Baseline 9/14: -8 degrees in supine 10/14: AROM -7 , PROM supine -7    Time 2    Period Weeks    Status On-going    Target Date 11/15/19      PT SHORT TERM GOAL #2   Title Patient will demonstrate R knee flexion AROM of ~100 degrees in order  to promote a more functional gait pattern.    Baseline 9/14: 96 degrees R knee in supine, 101 degrees PROM 10/14: seated AROM 95, PROM supine 97    Time 2    Period Weeks    Status On-going    Target Date 11/15/19      PT SHORT TERM GOAL #3   Title Patient will be independent with HEP to improve RLE strength and mobility in order to perform functional ADLs without pain.    Baseline HEP not administered at evaluation 10/14: intermittent compliance    Time 2    Period Weeks    Status On-going    Target Date 11/15/19             PT Long Term Goals - 11/01/19 1609      PT LONG TERM GOAL #1   Title Patient will increase FOTO score to equal to or greater than 74 to demonstrate statistically significant improvement in mobility and quality of life.     Baseline 9/14: 61% 10/14: 59    Time 4    Period Weeks    Status On-going    Target Date 11/29/19      PT LONG TERM GOAL #2   Title Patient will increase lower extremity functional scale to >60/80 todemonstrate improved functional mobility and increased tolerance with ADLs.    Baseline 9/14: 52/80 10/14: 57/80    Time 4    Period Weeks    Status Partially Met    Target Date 11/29/19      PT LONG TERM GOAL #3   Title Patient will increase 10 meter walk test to >1.30ms with improved midline awareness to improve gait speed for better community ambulation and to reduce fall risk.    Baseline 9/14: 1.232m without AD 10/14: 1.4 m/s    Time 4    Period Weeks    Status Achieved      PT LONG TERM GOAL #4   Title Patient will increase six minute walk test distance to >1400 with improved midline awareness for progression to community ambulator and improve gait ability    Baseline 9/14: not assessed at evaluation due to patient's footwear 10/14: 1230 ft    Time 4    Period Weeks    Status Partially Met    Target Date 11/29/19      PT LONG TERM GOAL #5   Title Patient will increase Berg Balance score to > 51/56 points to demonstrate decreased fall risk during functional activities.    Baseline 9/14: not assessed at evaluation due to patient's footwear; 10/14: 50/56    Time 4    Period Weeks    Status Partially Met    Target Date 11/29/19                 Plan - 11/05/19 1408    Clinical Impression Statement Continued with current POC aimed to address Rt knee hypomobility, weakness, and improve functional independence with basic mobility. Pt continues to have significant limitation in Rt knee ROM, intense pain with knee flexion stretching. Pt tolerates gentle loading of resistance exercises this date, no increased discomfort or pain. Session ended with single limb support training of RLE, low confidence in attempted prolonged stance, but educated on context of single limb phase of  gait, pt able to more confidently perform weight shifting.    Personal Factors and Comorbidities Age;Comorbidity 1;Time since onset of injury/illness/exacerbation    Comorbidities DM    Examination-Activity Limitations Bed  Mobility;Bend;Squat;Locomotion Level;Transfers;Stairs;Stand;Sit    Examination-Participation Restrictions Community Activity;Cleaning;Driving;Shop;Yard Work    [employer]chant [employer] officer Evolving/Moderate complexity    Clinical Decision Making Moderate    Rehab Potential Good    PT Frequency 2x / week    PT Duration 4 weeks    PT Treatment/Interventions ADLs/Self Care Home Management;Cryotherapy;Stair training;Moist Heat;Therapeutic exercise;Balance training;Functional mobility training;Therapeutic activities;Neuromuscular re-education;Patient/family education;Manual techniques;Scar mobilization;Energy conservation;Dry needling;Taping;Manual lymph drainage;Gait training;Compression bandaging;Passive range of motion;Aquatic Therapy    PT Next Visit Plan increase ROM, midline acceptance, RLE strengthening, 6MWT    Consulted and Agree with Plan of Care Patient           Patient will benefit from skilled therapeutic intervention in order to improve the following deficits and impairments:  Abnormal gait, Decreased activity tolerance, Decreased endurance, Decreased range of motion, Decreased strength, Hypomobility, Increased fascial restricitons, Impaired sensation, Pain, Postural dysfunction, Difficulty walking, Decreased mobility, Improper body mechanics, Decreased balance  Visit Diagnosis: Stiffness of right knee, not elsewhere classified  Other abnormalities of gait and mobility  Muscle weakness (generalized)  Difficulty in walking, not elsewhere classified     Problem List There are no problems to display for this patient.  2:37 PM, 11/05/19 Etta Grandchild, PT, DPT Physical Therapist - [facility]  Outpatient Physical  Therapy- [place] [phone]     Etta Grandchild 11/05/2019, 2:21 PM  [place] MAIN [facility] SERVICES [address] [place], [place], [address] Phone: [phone]   Fax:  [phone]  Name: Virginia Hunt MRN: [id number] Date of Birth: 1945-10-07

## 2019-11-07 ENCOUNTER — Ambulatory Visit: Payer: Medicare HMO

## 2019-11-12 ENCOUNTER — Ambulatory Visit: Payer: Medicare HMO

## 2019-11-12 ENCOUNTER — Other Ambulatory Visit: Payer: Self-pay

## 2019-11-12 DIAGNOSIS — M25661 Stiffness of right knee, not elsewhere classified: Secondary | ICD-10-CM

## 2019-11-12 DIAGNOSIS — R2689 Other abnormalities of gait and mobility: Secondary | ICD-10-CM

## 2019-11-12 DIAGNOSIS — M6281 Muscle weakness (generalized): Secondary | ICD-10-CM

## 2019-11-12 NOTE — Therapy (Signed)
[place] MAIN [facility] SERVICES [address] [place], [place], [address] Phone: [phone]   Fax:  [phone]  Physical Therapy Treatment  Patient Details  Name: Virginia Hunt MRN: [id number] Date of Birth: 08/28/1945 Referring Provider (PT): Kizzie Bane MD   Encounter Date: 11/12/2019   PT End of Session - 11/12/19 1440    Visit Number 8    Number of Visits 14    Date for PT Re-Evaluation 11/29/19    Authorization Type 8/10 eval 9/14    PT Start Time 1352    PT Stop Time 1430    PT Time Calculation (min) 38 min    Equipment Utilized During Treatment Gait belt    Activity Tolerance Patient tolerated treatment well    Behavior During Therapy [facility] for tasks assessed/performed           Past Medical History:  Diagnosis Date  . Arthritis   . Carpal tunnel syndrome   . Diabetes mellitus without complication ([place])    pre diabetes  . Diverticulitis   . Fibromyalgia   . Hypertension     Past Surgical History:  Procedure Laterality Date  . ABDOMINAL HYSTERECTOMY    . CHOLECYSTECTOMY    . TONSILLECTOMY      There were no vitals filed for this visit.   Subjective Assessment - 11/12/19 1437    Subjective Patient arrived late to session, no reason given. Patient reports missing last session due to "sleeping through alarm" due to recent increase in night shifts at work. Reports severe stiffness today    Pertinent History Pt reports R TKA on Jun 04, 2019, since has been working with [place]. Pt reports prior to surgery had 'years' of knee pain with cortisone injection therapy for 10-12 years. Pt reports significant limitations in basic mobility. Pt reports having had PT in the past for insidious gait instability, shuffling, LOB. Pt reports this has since resolved. Patient attended OP physical therapy to address strength, ROM and balance impairments with self d/c 09/12/19, despite PT advisory to continue. Patient advised to return to physical  therapy to regain ROM in post-operative knee. Patient notes no problem lifting RLE at this point in rehabilitation process.    Limitations Walking;Lifting;Standing;House hold activities    Currently in Pain? Yes    Pain Score 5     Pain Location Knee    Pain Orientation Right    Pain Descriptors / Indicators Aching    Pain Type Chronic pain;Surgical pain    Pain Onset More than a month ago    Pain Frequency Intermittent             Patient reports she slept through her last session.      Manual:   -prone hip flexor/knee flexion stretch with overpressure for optimal lengthening and angle 6x 30 second holds, first two without towel under distal aspect of knee.  -patella mobilization: inferior, superior, medial lateral grade I 10 seconds x 2 trials each direction -supine contract/relax against PT resistance for increasing knee flexion and extension 10x 10 second holds -hamstring lengthening of RLE, 30 seconds with leg on PT shoulder, distraction for pain relief   -roller to R hamstring 3 minutes.      Therex:   Standing next to support surface: Second step knee flexion RLE 5x 10 second holds, knee extension LLE 5x10 second holds  Single limb stance modified with LLE on step;  challenging to stabilize on single limb 45 second holds Eccentric sumo  squat 10x 5 second holds 3 way hip raise 10x each LE    -ambulate 50 ft with focus on heel strike and toe off: patient has "kick" mechanism of throwing limb forward to propel it; decreased with cueing and extrinsic combined with intrinsic cueing.        Prone: Hamstring curl RLE 15x 3 second ; 2lb weight; 2 sets       Seated: Sit to stand 10x with focus on equal weight shift and eccentric control with resisted weight (2000 gr)   Pt educated throughout session about proper posture and technique with exercises. Improved exercise technique, movement at target joints, use of target muscles after min to mod verbal, visual, tactile  cues                      PT Education - 11/12/19 1439    Education Details exercise technique, body mechanics, need for compliance of attendance    Person(s) Educated Patient    Methods Explanation;Demonstration;Tactile cues;Verbal cues    Comprehension Verbalized understanding;Returned demonstration;Verbal cues required;Tactile cues required            PT Short Term Goals - 11/01/19 1624      PT SHORT TERM GOAL #1   Title Patient will demonstrate improved R knee extension to -5 degrees in order to promote a more functional gait pattern.    Baseline 9/14: -8 degrees in supine 10/14: AROM -7 , PROM supine -7    Time 2    Period Weeks    Status On-going    Target Date 11/15/19      PT SHORT TERM GOAL #2   Title Patient will demonstrate R knee flexion AROM of ~100 degrees in order to promote a more functional gait pattern.    Baseline 9/14: 96 degrees R knee in supine, 101 degrees PROM 10/14: seated AROM 95, PROM supine 97    Time 2    Period Weeks    Status On-going    Target Date 11/15/19      PT SHORT TERM GOAL #3   Title Patient will be independent with HEP to improve RLE strength and mobility in order to perform functional ADLs without pain.    Baseline HEP not administered at evaluation 10/14: intermittent compliance    Time 2    Period Weeks    Status On-going    Target Date 11/15/19             PT Long Term Goals - 11/01/19 1609      PT LONG TERM GOAL #1   Title Patient will increase FOTO score to equal to or greater than 74 to demonstrate statistically significant improvement in mobility and quality of life.    Baseline 9/14: 61% 10/14: 59    Time 4    Period Weeks    Status On-going    Target Date 11/29/19      PT LONG TERM GOAL #2   Title Patient will increase lower extremity functional scale to >60/80 todemonstrate improved functional mobility and increased tolerance with ADLs.    Baseline 9/14: 52/80 10/14: 57/80    Time 4     Period Weeks    Status Partially Met    Target Date 11/29/19      PT LONG TERM GOAL #3   Title Patient will increase 10 meter walk test to >1.17ms with improved midline awareness to improve gait speed for better community ambulation and to reduce fall risk.  Baseline 9/14: 1.59ms without AD 10/14: 1.4 m/s    Time 4    Period Weeks    Status Achieved      PT LONG TERM GOAL #4   Title Patient will increase six minute walk test distance to >1400 with improved midline awareness for progression to community ambulator and improve gait ability    Baseline 9/14: not assessed at evaluation due to patient's footwear 10/14: 1230 ft    Time 4    Period Weeks    Status Partially Met    Target Date 11/29/19      PT LONG TERM GOAL #5   Title Patient will increase Berg Balance score to > 51/56 points to demonstrate decreased fall risk during functional activities.    Baseline 9/14: not assessed at evaluation due to patient's footwear; 10/14: 50/56    Time 4    Period Weeks    Status Partially Met    Target Date 11/29/19                 Plan - 11/12/19 1440    Clinical Impression Statement Patient arrived late and has been averaging only attending ~50% of sessions scheduled indicating poor prognosis at this time. Patient educated yet again this session on need for compliance of attendance for progression. Patient will benefit from skilled PT to address strength and ROM deficits and reduce pain in order to maximize functional independence at home    Personal Factors and Comorbidities Age;Comorbidity 1;Time since onset of injury/illness/exacerbation    Comorbidities DM    Examination-Activity Limitations Bed Mobility;Bend;Squat;Locomotion Level;Transfers;Stairs;Stand;Sit    Examination-Participation Restrictions Community Activity;Cleaning;Driving;Shop;Yard Work    SMerchant navy officerEvolving/Moderate complexity    Rehab Potential Good    PT Frequency 2x / week    PT  Duration 4 weeks    PT Treatment/Interventions ADLs/Self Care Home Management;Cryotherapy;Stair training;Moist Heat;Therapeutic exercise;Balance training;Functional mobility training;Therapeutic activities;Neuromuscular re-education;Patient/family education;Manual techniques;Scar mobilization;Energy conservation;Dry needling;Taping;Manual lymph drainage;Gait training;Compression bandaging;Passive range of motion;Aquatic Therapy    PT Next Visit Plan increase ROM, midline acceptance, RLE strengthening, 6MWT    Consulted and Agree with Plan of Care Patient           Patient will benefit from skilled therapeutic intervention in order to improve the following deficits and impairments:  Abnormal gait, Decreased activity tolerance, Decreased endurance, Decreased range of motion, Decreased strength, Hypomobility, Increased fascial restricitons, Impaired sensation, Pain, Postural dysfunction, Difficulty walking, Decreased mobility, Improper body mechanics, Decreased balance  Visit Diagnosis: Stiffness of right knee, not elsewhere classified  Other abnormalities of gait and mobility  Muscle weakness (generalized)     Problem List There are no problems to display for this patient.  MJanna Arch PT, DPT    11/12/2019, 2:41 PM  [place] [facility] CenterSERV[facility] [address] [address]Phone: [phone]  Fax:  [phone] Name: SLanaya BennisMRN: [id number] of Birth: 2January 09, 1947

## 2019-11-14 ENCOUNTER — Other Ambulatory Visit: Payer: Self-pay

## 2019-11-14 ENCOUNTER — Ambulatory Visit: Payer: Medicare HMO

## 2019-11-14 DIAGNOSIS — M25661 Stiffness of right knee, not elsewhere classified: Secondary | ICD-10-CM

## 2019-11-14 DIAGNOSIS — R2689 Other abnormalities of gait and mobility: Secondary | ICD-10-CM

## 2019-11-14 DIAGNOSIS — M6281 Muscle weakness (generalized): Secondary | ICD-10-CM

## 2019-11-14 NOTE — Therapy (Signed)
[place] MAIN [facility] SERVICES [address] [place], [place], [address] Phone: [phone]   Fax:  [phone]  Physical Therapy Treatment  Patient Details  Name: Virginia Hunt MRN: [id number] Date of Birth: 18-Jul-1945 Referring Provider (PT): Kizzie Bane MD   Encounter Date: 11/14/2019   PT End of Session - 11/14/19 1359    Visit Number 9    Number of Visits 14    Date for PT Re-Evaluation 11/29/19    Authorization Type 9/10 eval 9/14    PT Start Time 1352    PT Stop Time 1430    PT Time Calculation (min) 38 min    Equipment Utilized During Treatment Gait belt    Activity Tolerance Patient tolerated treatment well    Behavior During Therapy [facility] for tasks assessed/performed           Past Medical History:  Diagnosis Date  . Arthritis   . Carpal tunnel syndrome   . Diabetes mellitus without complication ([place])    pre diabetes  . Diverticulitis   . Fibromyalgia   . Hypertension     Past Surgical History:  Procedure Laterality Date  . ABDOMINAL HYSTERECTOMY    . CHOLECYSTECTOMY    . TONSILLECTOMY      There were no vitals filed for this visit.   Subjective Assessment - 11/14/19 1357    Subjective Patient saw pain management yesterday. Got off of night shift at 11 this morning, reports her leg wasn't too bad today.    Pertinent History Pt reports R TKA on Jun 04, 2019, since has been working with [place]. Pt reports prior to surgery had 'years' of knee pain with cortisone injection therapy for 10-12 years. Pt reports significant limitations in basic mobility. Pt reports having had PT in the past for insidious gait instability, shuffling, LOB. Pt reports this has since resolved. Patient attended OP physical therapy to address strength, ROM and balance impairments with self d/c 09/12/19, despite PT advisory to continue. Patient advised to return to physical therapy to regain ROM in post-operative knee. Patient notes no problem  lifting RLE at this point in rehabilitation process.    Limitations Walking;Lifting;Standing;House hold activities    Currently in Pain? No/denies               Manual:   -prone hip flexor/knee flexion stretch with overpressure for optimal lengthening and angle 6x 30 second holds, first two without towel under distal aspect of knee.  -patella mobilization: inferior, superior, medial lateral grade I 10 seconds x 2 trials each direction -supine contract/relax against PT resistance for increasing knee flexion and extension 10x 10 second holds -hamstring lengthening of RLE, 30 seconds with leg on PT shoulder, distraction for pain relief   -roller to R hamstring 3 minutes.      Therex:   NuStep Lvl 3 RPM> 80, 3 minutes  Standing next to support surface: RTB around feet lateral stepping 2x length of // bars Walking hamstring stretch 2x length of // bars.  High knee marches 2x length of // bars Modified forward bosu lunge with BUE support 10x Lateral modified bosu lunge with BUE support 10x  Single limb stance modified with LLE on step;  challenging to stabilize on single limb 45 second holds      -ambulate 50 ft with focus on heel strike and toe off: patient has "kick" mechanism of throwing limb forward to propel it; decreased with cueing and extrinsic combined with intrinsic cueing.  Prone: Hamstring curl RLE 15x 3 second ; 3lb weight; 2 sets    Hip extension RLE 10x with Min A for stabilization, 2 sets    Seated: Sit to stand 10x with focus on equal weight shift and eccentric control with resisted weight (2000 gr)   Pt educated throughout session about proper posture and technique with exercises. Improved exercise technique, movement at target joints, use of target muscles after min to mod verbal, visual, tactile cues                     PT Education - 11/14/19 1358    Education Details exercise technique, body mechanics,    Person(s) Educated Patient     Methods Explanation;Demonstration;Tactile cues;Verbal cues    Comprehension Verbalized understanding;Returned demonstration;Verbal cues required;Tactile cues required            PT Short Term Goals - 11/01/19 1624      PT SHORT TERM GOAL #1   Title Patient will demonstrate improved R knee extension to -5 degrees in order to promote a more functional gait pattern.    Baseline 9/14: -8 degrees in supine 10/14: AROM -7 , PROM supine -7    Time 2    Period Weeks    Status On-going    Target Date 11/15/19      PT SHORT TERM GOAL #2   Title Patient will demonstrate R knee flexion AROM of ~100 degrees in order to promote a more functional gait pattern.    Baseline 9/14: 96 degrees R knee in supine, 101 degrees PROM 10/14: seated AROM 95, PROM supine 97    Time 2    Period Weeks    Status On-going    Target Date 11/15/19      PT SHORT TERM GOAL #3   Title Patient will be independent with HEP to improve RLE strength and mobility in order to perform functional ADLs without pain.    Baseline HEP not administered at evaluation 10/14: intermittent compliance    Time 2    Period Weeks    Status On-going    Target Date 11/15/19             PT Long Term Goals - 11/01/19 1609      PT LONG TERM GOAL #1   Title Patient will increase FOTO score to equal to or greater than 74 to demonstrate statistically significant improvement in mobility and quality of life.    Baseline 9/14: 61% 10/14: 59    Time 4    Period Weeks    Status On-going    Target Date 11/29/19      PT LONG TERM GOAL #2   Title Patient will increase lower extremity functional scale to >60/80 todemonstrate improved functional mobility and increased tolerance with ADLs.    Baseline 9/14: 52/80 10/14: 57/80    Time 4    Period Weeks    Status Partially Met    Target Date 11/29/19      PT LONG TERM GOAL #3   Title Patient will increase 10 meter walk test to >1.80ms with improved midline awareness to improve gait  speed for better community ambulation and to reduce fall risk.    Baseline 9/14: 1.218m without AD 10/14: 1.4 m/s    Time 4    Period Weeks    Status Achieved      PT LONG TERM GOAL #4   Title Patient will increase six minute walk test distance to >1400  with improved midline awareness for progression to community ambulator and improve gait ability    Baseline 9/14: not assessed at evaluation due to patient's footwear 10/14: 1230 ft    Time 4    Period Weeks    Status Partially Met    Target Date 11/29/19      PT LONG TERM GOAL #5   Title Patient will increase Berg Balance score to > 51/56 points to demonstrate decreased fall risk during functional activities.    Baseline 9/14: not assessed at evaluation due to patient's footwear; 10/14: 50/56    Time 4    Period Weeks    Status Partially Met    Target Date 11/29/19                 Plan - 11/14/19 1430    Clinical Impression Statement Patient tolerates progression of strengthening and mobilization well with 1lb increase in ankle weights. Standing in the // bars interventions progressed with bosu ball to increase challenge on unstable surface. Patient will benefit from skilled PT to address strength and ROM deficits and reduce pain in order to maximize functional independence at home    Personal Factors and Comorbidities Age;Comorbidity 1;Time since onset of injury/illness/exacerbation    Comorbidities DM    Examination-Activity Limitations Bed Mobility;Bend;Squat;Locomotion Level;Transfers;Stairs;Stand;Sit    Examination-Participation Restrictions Community Activity;Cleaning;Driving;Shop;Yard Work    [employer]chant [employer] officer Evolving/Moderate complexity    Rehab Potential Good    PT Frequency 2x / week    PT Duration 4 weeks    PT Treatment/Interventions ADLs/Self Care Home Management;Cryotherapy;Stair training;Moist Heat;Therapeutic exercise;Balance training;Functional mobility training;Therapeutic  activities;Neuromuscular re-education;Patient/family education;Manual techniques;Scar mobilization;Energy conservation;Dry needling;Taping;Manual lymph drainage;Gait training;Compression bandaging;Passive range of motion;Aquatic Therapy    PT Next Visit Plan increase ROM, midline acceptance, RLE strengthening, 6MWT    Consulted and Agree with Plan of Care Patient           Patient will benefit from skilled therapeutic intervention in order to improve the following deficits and impairments:  Abnormal gait, Decreased activity tolerance, Decreased endurance, Decreased range of motion, Decreased strength, Hypomobility, Increased fascial restricitons, Impaired sensation, Pain, Postural dysfunction, Difficulty walking, Decreased mobility, Improper body mechanics, Decreased balance  Visit Diagnosis: Stiffness of right knee, not elsewhere classified  Other abnormalities of gait and mobility  Muscle weakness (generalized)     Problem List There are no problems to display for this patient.  Janna Arch, PT, DPT   11/14/2019, 2:32 PM  [place] MAIN [facility] SERVICES [address] [place], [place], [address] Phone: [phone]   Fax:  [phone]  Name: Virginia Hunt MRN: [id number] Date of Birth: July 10, 1945

## 2019-11-19 ENCOUNTER — Other Ambulatory Visit: Payer: Self-pay

## 2019-11-19 ENCOUNTER — Ambulatory Visit: Payer: Medicare HMO | Attending: Orthopedic Surgery

## 2019-11-19 DIAGNOSIS — M6281 Muscle weakness (generalized): Secondary | ICD-10-CM

## 2019-11-19 DIAGNOSIS — R2689 Other abnormalities of gait and mobility: Secondary | ICD-10-CM

## 2019-11-19 DIAGNOSIS — R262 Difficulty in walking, not elsewhere classified: Secondary | ICD-10-CM | POA: Diagnosis present

## 2019-11-19 DIAGNOSIS — M25661 Stiffness of right knee, not elsewhere classified: Secondary | ICD-10-CM | POA: Diagnosis present

## 2019-11-19 NOTE — Therapy (Signed)
[place] MAIN [facility] SERVICES [address] [place], [place], [address] Phone: [phone]   Fax:  [phone]  Physical Therapy Treatment Physical Therapy Progress Note   Dates of reporting period  10/02/19   to   11/19/19  Patient Details  Name: Virginia Hunt MRN: [id number] Date of Birth: October 02, 1945 Referring Provider (PT): Kizzie Bane MD   Encounter Date: 11/19/2019   PT End of Session - 11/19/19 1601    Visit Number 10    Number of Visits 14    Date for PT Re-Evaluation 11/29/19    Authorization Type 10/10 eval 9/14; next session 1/10 Pn 11/1    PT Start Time 2683    PT Stop Time 1430    PT Time Calculation (min) 41 min    Equipment Utilized During Treatment Gait belt    Activity Tolerance Patient tolerated treatment well    Behavior During Therapy WFL for tasks assessed/performed           Past Medical History:  Diagnosis Date  . Arthritis   . Carpal tunnel syndrome   . Diabetes mellitus without complication ([place])    pre diabetes  . Diverticulitis   . Fibromyalgia   . Hypertension     Past Surgical History:  Procedure Laterality Date  . ABDOMINAL HYSTERECTOMY    . CHOLECYSTECTOMY    . TONSILLECTOMY      There were no vitals filed for this visit.   Subjective Assessment - 11/19/19 1556    Subjective Patient mixed up her times, presents without a cane today. Didn't work last night. Has been compliant with HEP.    Pertinent History Pt reports R TKA on Jun 04, 2019, since has been working with [facility]. Pt reports prior to surgery had 'years' of knee pain with cortisone injection therapy for 10-12 years. Pt reports significant limitations in basic mobility. Pt reports having had PT in the past for insidious gait instability, shuffling, LOB. Pt reports this has since resolved. Patient attended OP physical therapy to address strength, ROM and balance impairments with self d/c 09/12/19, despite PT advisory to continue. Patient  advised to return to physical therapy to regain ROM in post-operative knee. Patient notes no problem lifting RLE at this point in rehabilitation process.    Limitations Walking;Lifting;Standing;House hold activities    Currently in Pain? Yes    Pain Score 1     Pain Location Leg    Pain Orientation Posterior;Right    Pain Descriptors / Indicators Aching    Pain Type Chronic pain;Surgical pain    Pain Onset More than a month ago    Pain Frequency Intermittent              [facility] PT Assessment - 11/19/19 0001      Standardized Balance Assessment   Standardized Balance Assessment Berg Balance Test      Berg Balance Test   Sit to Stand Able to stand without using hands and stabilize independently    Standing Unsupported Able to stand safely 2 minutes    Sitting with Back Unsupported but Feet Supported on Floor or Stool Able to sit safely and securely 2 minutes    Stand to Sit Sits safely with minimal use of hands    Transfers Able to transfer safely, minor use of hands    Standing Unsupported with Eyes Closed Able to stand 10 seconds safely    Standing Unsupported with Feet Together Able to place feet together independently and stand  1 minute safely    From Standing, Reach Forward with Outstretched Arm Can reach confidently >25 cm (10")    From Standing Position, Pick up Object from [place] to pick up shoe safely and easily    From Standing Position, Turn to Look Behind Over each Shoulder Looks behind from both sides and weight shifts well    Turn 360 Degrees Able to turn 360 degrees safely in 4 seconds or less    Standing Unsupported, Alternately Place Feet on Step/Stool Able to stand independently and complete 8 steps >20 seconds    Standing Unsupported, One Foot in Front Able to plae foot ahead of the other independently and hold 30 seconds    Standing on One Leg Able to lift leg independently and hold equal to or more than 3 seconds    Total Score 52           Goals:  R  knee ROM: flexion supine: AROM 101 PROM 109 ; extension AROM -6 PROM -5  FOTO: 67% LEFS: perform next time 6 MWT: perform next session  BERG: 52/56    Treatment:  Manual: -prone hip flexor/knee flexion stretch with overpressure for optimal lengthening and angle6x 30 second holds,first two without towel under distal aspect of knee. -patella mobilization: inferior, superior, medial lateral grade I 10 seconds x 2 trials each direction -supine contract/relax against PT resistance for increasing knee flexion and extension 10x 10 second holds -hamstring lengthening of RLE, 30 seconds with leg on PT shoulder, distraction for pain relief -roller to R hamstring 3 minutes.  TherEx Supine:  2.5 ankle weight RLE SLR 10x Seated:  2.5 ankle weight LAQ, 10x straight, 10x ER, 10x IR  Contract relax seated against PT resistance with progressive flexion against resistance x 2 minutes STS: 5x cues for right LE posterior for increased stabilization and control of eccentric contraction -belt hamstring stretch 30 second holds (added to HEP)  Standing: Stair knee flexion on second step stretch 60 seconds  Stair hamstring stretch for knee extension 60 seconds on second step    Patient's condition has the potential to improve in response to therapy. Maximum improvement is yet to be obtained. The anticipated improvement is attainable and reasonable in a generally predictable time.  Patient reports she is moving better but not able to bend or straighten it as much as she would like.                       PT Education - 11/19/19 1557    Education Details goals, exercise technique, body mechanics    Person(s) Educated Patient    Methods Explanation;Demonstration;Tactile cues;Verbal cues    Comprehension Verbalized understanding;Returned demonstration;Verbal cues required;Tactile cues required            PT Short Term Goals - 11/19/19 1554      PT SHORT TERM GOAL #1   Title  Patient will demonstrate improved R knee extension to -5 degrees in order to promote a more functional gait pattern.    Baseline 9/14: -8 degrees in supine 10/14: AROM -7 , PROM supine -7 11/1: extension AROM -6 PROM -5    Time 2    Period Weeks    Status Partially Met    Target Date 12/03/19      PT SHORT TERM GOAL #2   Title Patient will demonstrate R knee flexion AROM of ~100 degrees in order to promote a more functional gait pattern.    Baseline 9/14:  96 degrees R knee in supine, 101 degrees PROM 10/14: seated AROM 95, PROM supine 97 11/1:  AROM 101 PROM 109    Time 2    Period Weeks    Status Partially Met    Target Date 12/03/19      PT SHORT TERM GOAL #3   Title Patient will be independent with HEP to improve RLE strength and mobility in order to perform functional ADLs without pain.    Baseline HEP not administered at evaluation 10/14: intermittent compliance 11/1: intermittent compliance    Time 2    Period Weeks    Status Partially Met    Target Date 12/03/19             PT Long Term Goals - 11/19/19 1555      PT LONG TERM GOAL #1   Title Patient will increase FOTO score to equal to or greater than 74 to demonstrate statistically significant improvement in mobility and quality of life.    Baseline 9/14: 61% 10/14: 59 11/1: 67%    Time 4    Period Weeks    Status Partially Met    Target Date 11/29/19      PT LONG TERM GOAL #2   Title Patient will increase lower extremity functional scale to >60/80 todemonstrate improved functional mobility and increased tolerance with ADLs.    Baseline 9/14: 52/80 10/14: 57/80    Time 4    Period Weeks    Status Partially Met    Target Date 11/29/19      PT LONG TERM GOAL #3   Title Patient will increase 10 meter walk test to >1.65ms with improved midline awareness to improve gait speed for better community ambulation and to reduce fall risk.    Baseline 9/14: 1.239m without AD 10/14: 1.4 m/s    Time 4    Period Weeks     Status Achieved      PT LONG TERM GOAL #4   Title Patient will increase six minute walk test distance to >1400 with improved midline awareness for progression to community ambulator and improve gait ability    Baseline 9/14: not assessed at evaluation due to patient's footwear 10/14: 1230 ft    Time 4    Period Weeks    Status Partially Met    Target Date 11/29/19      PT LONG TERM GOAL #5   Title Patient will increase Berg Balance score to > 51/56 points to demonstrate decreased fall risk during functional activities.    Baseline 9/14: not assessed at evaluation due to patient's footwear; 10/14: 50/56 11/1: 52/56    Time 4    Period Weeks    Status Achieved                 Plan - 11/19/19 1605    Clinical Impression Statement Patient is progressing towards functional goals at this time with increased flexion and extension. She has met her balance goal and is increasing in her function. Six minute walk will be assessed next time. Patient is improving in weight shift and acceptance on surgical limb at this time. Patient's condition has the potential to improve in response to therapy. Maximum improvement is yet to be obtained. The anticipated improvement is attainable and reasonable in a generally predictable time. Patient will benefit from skilled PT to address strength and ROM deficits and reduce pain in order to maximize functional independence at home    Personal Factors and Comorbidities Age;Comorbidity  1;Time since onset of injury/illness/exacerbation    Comorbidities DM    Examination-Activity Limitations Bed Mobility;Bend;Squat;Locomotion Level;Transfers;Stairs;Stand;Sit    Examination-Participation Restrictions Community Activity;Cleaning;Driving;Shop;Yard Work    [employer]chant [employer] officer Evolving/Moderate complexity    Rehab Potential Good    PT Frequency 2x / week    PT Duration 4 weeks    PT Treatment/Interventions ADLs/Self Care Home  Management;Cryotherapy;Stair training;Moist Heat;Therapeutic exercise;Balance training;Functional mobility training;Therapeutic activities;Neuromuscular re-education;Patient/family education;Manual techniques;Scar mobilization;Energy conservation;Dry needling;Taping;Manual lymph drainage;Gait training;Compression bandaging;Passive range of motion;Aquatic Therapy    PT Next Visit Plan increase ROM, midline acceptance, RLE strengthening, 6MWT    Consulted and Agree with Plan of Care Patient           Patient will benefit from skilled therapeutic intervention in order to improve the following deficits and impairments:  Abnormal gait, Decreased activity tolerance, Decreased endurance, Decreased range of motion, Decreased strength, Hypomobility, Increased fascial restricitons, Impaired sensation, Pain, Postural dysfunction, Difficulty walking, Decreased mobility, Improper body mechanics, Decreased balance  Visit Diagnosis: Stiffness of right knee, not elsewhere classified  Other abnormalities of gait and mobility  Muscle weakness (generalized)     Problem List There are no problems to display for this patient.  Janna Arch, PT, DPT    11/19/2019, 4:10 PM  [place] MAIN [facility] SERVICES [address] [place], [place], [address] Phone: [phone]   Fax:  [phone]  Name: Deaira Leckey MRN: [id number] Date of Birth: 1945/07/23

## 2019-11-21 ENCOUNTER — Ambulatory Visit: Payer: Medicare HMO

## 2019-11-21 ENCOUNTER — Other Ambulatory Visit: Payer: Self-pay

## 2019-11-21 DIAGNOSIS — M6281 Muscle weakness (generalized): Secondary | ICD-10-CM

## 2019-11-21 DIAGNOSIS — M25661 Stiffness of right knee, not elsewhere classified: Secondary | ICD-10-CM | POA: Diagnosis not present

## 2019-11-21 DIAGNOSIS — R2689 Other abnormalities of gait and mobility: Secondary | ICD-10-CM

## 2019-11-21 DIAGNOSIS — R262 Difficulty in walking, not elsewhere classified: Secondary | ICD-10-CM

## 2019-11-21 NOTE — Therapy (Signed)
[place] MAIN [facility] SERVICES [address] [place], [place], [address] Phone: [phone]   Fax:  [phone]  Physical Therapy Treatment  Patient Details  Name: Virginia Hunt MRN: [id number] Date of Birth: [birth year] Referring Provider (PT): Kizzie Bane MD   Encounter Date: 11/21/2019   PT End of Session - 11/21/19 1524    Visit Number 11    Number of Visits 14    Date for PT Re-Evaluation 11/29/19    Authorization Type 1/10 Pn 11/1    PT Start Time 1520    PT Stop Time 1600    PT Time Calculation (min) 40 min    Equipment Utilized During Treatment Gait belt    Activity Tolerance Patient tolerated treatment well    Behavior During Therapy [facility] for tasks assessed/performed           Past Medical History:  Diagnosis Date  . Arthritis   . Carpal tunnel syndrome   . Diabetes mellitus without complication ([place])    pre diabetes  . Diverticulitis   . Fibromyalgia   . Hypertension     Past Surgical History:  Procedure Laterality Date  . ABDOMINAL HYSTERECTOMY    . CHOLECYSTECTOMY    . TONSILLECTOMY      There were no vitals filed for this visit.   Subjective Assessment - 11/21/19 1522    Subjective Patient reports her knee is feeling better after last session. Patient reports just a dull ache today. Works Midwife.    Pertinent History Pt reports R TKA on Jun 04, 2019, since has been working with [place]. Pt reports prior to surgery had 'years' of knee pain with cortisone injection therapy for 10-12 years. Pt reports significant limitations in basic mobility. Pt reports having had PT in the past for insidious gait instability, shuffling, LOB. Pt reports this has since resolved. Patient attended OP physical therapy to address strength, ROM and balance impairments with self d/c 09/12/19, despite PT advisory to continue. Patient advised to return to physical therapy to regain ROM in post-operative knee. Patient notes no problem lifting  RLE at this point in rehabilitation process.    Limitations Walking;Lifting;Standing;House hold activities    Currently in Pain? Yes    Pain Score 1     Pain Location Knee    Pain Orientation Right    Pain Descriptors / Indicators Aching    Pain Type Chronic pain    Pain Onset More than a month ago    Pain Frequency Intermittent              Manual:   -prone hip flexor/knee flexion stretch with overpressure for optimal lengthening and angle 6x 30 second holds, first two without towel under distal aspect of knee.  -patella mobilization: inferior, superior, medial lateral grade I 10 seconds x 2 trials each direction -supine contract/relax against PT resistance for increasing knee flexion and extension 10x 10 second holds -hamstring lengthening of RLE, 30 seconds with leg on PT shoulder, distraction for pain relief     Therex:   NuStep Lvl 3 RPM> 80, 3 minutes  Standing next to support surface: 6" step crane step up 15x each LE Straight leg marches 4x length of // bars; cues for quad contraction  Modified forward bosu lunge with BUE support 10x Lateral modified bosu lunge with BUE support 10x  Single limb stance modified with LLE soccer ball;  challenging to stabilize on single limb 45 second holds x 3 trials  Anterior Posterior  tilt; 15x each LE placement Half foam roller: flat side up: df/pf with focus on gastroc lengthening 20x Seated 6" step hamstring lengthening RLE 60 seconds x 3 trials    -ambulate 50 ft with focus on heel strike and toe off: patient has "kick" mechanism of throwing limb forward to propel it; decreased with cueing and extrinsic combined with intrinsic cueing.    Seated: Sit to stand 10x with focus on equal weight shift and eccentric control with resisted weight (2000 gr) 3lb  ankle weight LAQ, 10x straight, 10x ER, 10x IR   Pt educated throughout session about proper posture and technique with exercises. Improved exercise technique, movement at target  joints, use of target muscles after min to mod verbal, visual, tactile cues                           PT Education - 11/21/19 1524    Education Details exercise technique, body mechanics    Person(s) Educated Patient    Methods Explanation;Demonstration;Tactile cues;Verbal cues    Comprehension Verbalized understanding;Returned demonstration;Verbal cues required;Tactile cues required            PT Short Term Goals - 11/19/19 1554      PT SHORT TERM GOAL #1   Title Patient will demonstrate improved R knee extension to -5 degrees in order to promote a more functional gait pattern.    Baseline 9/14: -8 degrees in supine 10/14: AROM -7 , PROM supine -7 11/1: extension AROM -6 PROM -5    Time 2    Period Weeks    Status Partially Met    Target Date 12/03/19      PT SHORT TERM GOAL #2   Title Patient will demonstrate R knee flexion AROM of ~100 degrees in order to promote a more functional gait pattern.    Baseline 9/14: 96 degrees R knee in supine, 101 degrees PROM 10/14: seated AROM 95, PROM supine 97 11/1:  AROM 101 PROM 109    Time 2    Period Weeks    Status Partially Met    Target Date 12/03/19      PT SHORT TERM GOAL #3   Title Patient will be independent with HEP to improve RLE strength and mobility in order to perform functional ADLs without pain.    Baseline HEP not administered at evaluation 10/14: intermittent compliance 11/1: intermittent compliance    Time 2    Period Weeks    Status Partially Met    Target Date 12/03/19             PT Long Term Goals - 11/19/19 1555      PT LONG TERM GOAL #1   Title Patient will increase FOTO score to equal to or greater than 74 to demonstrate statistically significant improvement in mobility and quality of life.    Baseline 9/14: 61% 10/14: 59 11/1: 67%    Time 4    Period Weeks    Status Partially Met    Target Date 11/29/19      PT LONG TERM GOAL #2   Title Patient will increase lower extremity  functional scale to >60/80 todemonstrate improved functional mobility and increased tolerance with ADLs.    Baseline 9/14: 52/80 10/14: 57/80    Time 4    Period Weeks    Status Partially Met    Target Date 11/29/19      PT LONG TERM GOAL #3   Title  Patient will increase 10 meter walk test to >1.52ms with improved midline awareness to improve gait speed for better community ambulation and to reduce fall risk.    Baseline 9/14: 1.268m without AD 10/14: 1.4 m/s    Time 4    Period Weeks    Status Achieved      PT LONG TERM GOAL #4   Title Patient will increase six minute walk test distance to >1400 with improved midline awareness for progression to community ambulator and improve gait ability    Baseline 9/14: not assessed at evaluation due to patient's footwear 10/14: 1230 ft    Time 4    Period Weeks    Status Partially Met    Target Date 11/29/19      PT LONG TERM GOAL #5   Title Patient will increase Berg Balance score to > 51/56 points to demonstrate decreased fall risk during functional activities.    Baseline 9/14: not assessed at evaluation due to patient's footwear; 10/14: 50/56 11/1: 52/56    Time 4    Period Weeks    Status Achieved                 Plan - 11/21/19 1708    Clinical Impression Statement Patient presents with excellent motivation throughout physical therapy session. Continues to be challenged with quad contraction in single limb stance due to fatigue at this time. Unstable surfaces and weight shift interventions continue to challenge patient as she progresses in her mobility.  Patient will benefit from skilled PT to address strength and ROM deficits and reduce pain in order to maximize functional independence at home    Personal Factors and Comorbidities Age;Comorbidity 1;Time since onset of injury/illness/exacerbation    Comorbidities DM    Examination-Activity Limitations Bed Mobility;Bend;Squat;Locomotion Level;Transfers;Stairs;Stand;Sit     Examination-Participation Restrictions Community Activity;Cleaning;Driving;Shop;Yard Work    [employer] officervolving/Moderate complexity    Rehab Potential Good    PT Frequency 2x / week    PT Duration 4 weeks    PT Treatment/Interventions ADLs/Self Care Home Management;Cryotherapy;Stair training;Moist Heat;Therapeutic exercise;Balance training;Functional mobility training;Therapeutic activities;Neuromuscular re-education;Patient/family education;Manual techniques;Scar mobilization;Energy conservation;Dry needling;Taping;Manual lymph drainage;Gait training;Compression bandaging;Passive range of motion;Aquatic Therapy    PT Next Visit Plan increase ROM, midline acceptance, RLE strengthening, 6MWT    Consulted and Agree with Plan of Care Patient           Patient will benefit from skilled therapeutic intervention in order to improve the following deficits and impairments:  Abnormal gait, Decreased activity tolerance, Decreased endurance, Decreased range of motion, Decreased strength, Hypomobility, Increased fascial restricitons, Impaired sensation, Pain, Postural dysfunction, Difficulty walking, Decreased mobility, Improper body mechanics, Decreased balance  Visit Diagnosis: Stiffness of right knee, not elsewhere classified  Other abnormalities of gait and mobility  Muscle weakness (generalized)  Difficulty in walking, not elsewhere classified     Problem List There are no problems to display for this patient.  MaJanna ArchPT, DPT   11/21/2019, 5:09 PM  CoWashington ParkAIN RERio Grande [facility] HospitalERVICES [address]hone: [phone] Fax:  [phone]: SaTakesha StegerRN: [id number] of Birth: 03/01/02/1947

## 2019-11-26 ENCOUNTER — Ambulatory Visit: Payer: Medicare HMO

## 2019-11-26 ENCOUNTER — Other Ambulatory Visit: Payer: Self-pay

## 2019-11-26 DIAGNOSIS — M25661 Stiffness of right knee, not elsewhere classified: Secondary | ICD-10-CM | POA: Diagnosis not present

## 2019-11-26 DIAGNOSIS — R2689 Other abnormalities of gait and mobility: Secondary | ICD-10-CM

## 2019-11-26 DIAGNOSIS — M6281 Muscle weakness (generalized): Secondary | ICD-10-CM

## 2019-11-26 NOTE — Therapy (Signed)
[place] MAIN [facility] SERVICES [address] [place], [place], [address] Phone: [phone]   Fax:  [phone]  Physical Therapy Treatment  Patient Details  Name: Virginia Hunt MRN: [id number] Date of Birth: Jan 04, 1946 Referring Provider (PT): Kizzie Bane MD   Encounter Date: 11/26/2019   PT End of Session - 11/26/19 1608    Visit Number 12    Number of Visits 14    Date for PT Re-Evaluation 11/29/19    Authorization Type 2/10 Pn 11/1    PT Start Time 1515    PT Stop Time 1600    PT Time Calculation (min) 45 min    Equipment Utilized During Treatment Gait belt    Activity Tolerance Patient tolerated treatment well    Behavior During Therapy [facility] for tasks assessed/performed           Past Medical History:  Diagnosis Date  . Arthritis   . Carpal tunnel syndrome   . Diabetes mellitus without complication ([place])    pre diabetes  . Diverticulitis   . Fibromyalgia   . Hypertension     Past Surgical History:  Procedure Laterality Date  . ABDOMINAL HYSTERECTOMY    . CHOLECYSTECTOMY    . TONSILLECTOMY      There were no vitals filed for this visit.   Subjective Assessment - 11/26/19 1607    Subjective Patient reports her pins and needles were worse yesterday but the actual knee pain has been very low. No falls or LOB since last session.    Pertinent History Pt reports R TKA on Jun 04, 2019, since has been working with [place]. Pt reports prior to surgery had 'years' of knee pain with cortisone injection therapy for 10-12 years. Pt reports significant limitations in basic mobility. Pt reports having had PT in the past for insidious gait instability, shuffling, LOB. Pt reports this has since resolved. Patient attended OP physical therapy to address strength, ROM and balance impairments with self d/c 09/12/19, despite PT advisory to continue. Patient advised to return to physical therapy to regain ROM in post-operative knee. Patient notes  no problem lifting RLE at this point in rehabilitation process.    Limitations Walking;Lifting;Standing;House hold activities    Currently in Pain? Yes    Pain Score 1     Pain Location Knee    Pain Orientation Right    Pain Descriptors / Indicators Aching    Pain Type Chronic pain    Pain Onset More than a month ago    Pain Frequency Intermittent                Wants to practice bending knee to get in/out of car   Patient reports pain is 2/10     Manual:   -prone hip flexor/knee flexion stretch with overpressure for optimal lengthening and angle 6x 30 second holds, first two without towel under distal aspect of knee.  -patella mobilization: inferior, superior, medial lateral grade I 10 seconds x 2 trials each direction -supine contract/relax against PT resistance for increasing knee flexion and extension 10x 10 second holds -hamstring lengthening of RLE, 30 seconds with leg on PT shoulder, distraction for pain relief  -supine knee extension overpressure 4x 20 second holds     Therex:   NuStep Lvl 3 RPM> 80, 3 minutes  Standing next to support surface: 4lb ankle weights:  -6" step step up 15x each LE -6" step lateral step ups 15x each LE  -6" step toe taps  15x each LE  airex pad 6" step : modified tandem stance 2x 30 seconds  Single limb stance modified with BUE support  challenging to stabilize on single limb 45 second holds x 2 trials  Half foam roller: flat side up: df/pf with focus on gastroc lengthening 20x Stair hamstring stretch 60 second holds x 2 trials, knee flexion hold x2 trials 60 seconds -speed ladder: one foot each square for equal step length 4x length of // bars, improved with repetition Speed ladder lateral stepping with focus on foot clearance 4x length of // bars improved with repetition    -ambulate 50 ft with focus on heel strike and toe off: patient has "kick" mechanism of throwing limb forward to propel it; decreased with cueing and extrinsic  combined with intrinsic cueing.    Seated: Sit to stand RLE only on raised plinth table with UE support from PT x 5 3lb  ankle weight LAQ, 10x straight, 10x ER, 10x IR    Pt educated throughout session about proper posture and technique with exercises. Improved exercise technique, movement at target joints, use of target muscles after min to mod verbal, visual, tactile cues                     PT Education - 11/26/19 1518    Education Details exercise technique, body mechanics    Person(s) Educated Patient    Methods Explanation;Demonstration;Tactile cues;Verbal cues    Comprehension Verbalized understanding;Returned demonstration;Verbal cues required;Tactile cues required            PT Short Term Goals - 11/19/19 1554      PT SHORT TERM GOAL #1   Title Patient will demonstrate improved R knee extension to -5 degrees in order to promote a more functional gait pattern.    Baseline 9/14: -8 degrees in supine 10/14: AROM -7 , PROM supine -7 11/1: extension AROM -6 PROM -5    Time 2    Period Weeks    Status Partially Met    Target Date 12/03/19      PT SHORT TERM GOAL #2   Title Patient will demonstrate R knee flexion AROM of ~100 degrees in order to promote a more functional gait pattern.    Baseline 9/14: 96 degrees R knee in supine, 101 degrees PROM 10/14: seated AROM 95, PROM supine 97 11/1:  AROM 101 PROM 109    Time 2    Period Weeks    Status Partially Met    Target Date 12/03/19      PT SHORT TERM GOAL #3   Title Patient will be independent with HEP to improve RLE strength and mobility in order to perform functional ADLs without pain.    Baseline HEP not administered at evaluation 10/14: intermittent compliance 11/1: intermittent compliance    Time 2    Period Weeks    Status Partially Met    Target Date 12/03/19             PT Long Term Goals - 11/19/19 1555      PT LONG TERM GOAL #1   Title Patient will increase FOTO score to equal to or  greater than 74 to demonstrate statistically significant improvement in mobility and quality of life.    Baseline 9/14: 61% 10/14: 59 11/1: 67%    Time 4    Period Weeks    Status Partially Met    Target Date 11/29/19      PT LONG TERM GOAL #  2   Title Patient will increase lower extremity functional scale to >60/80 todemonstrate improved functional mobility and increased tolerance with ADLs.    Baseline 9/14: 52/80 10/14: 57/80    Time 4    Period Weeks    Status Partially Met    Target Date 11/29/19      PT LONG TERM GOAL #3   Title Patient will increase 10 meter walk test to >1.50ms with improved midline awareness to improve gait speed for better community ambulation and to reduce fall risk.    Baseline 9/14: 1.289m without AD 10/14: 1.4 m/s    Time 4    Period Weeks    Status Achieved      PT LONG TERM GOAL #4   Title Patient will increase six minute walk test distance to >1400 with improved midline awareness for progression to community ambulator and improve gait ability    Baseline 9/14: not assessed at evaluation due to patient's footwear 10/14: 1230 ft    Time 4    Period Weeks    Status Partially Met    Target Date 11/29/19      PT LONG TERM GOAL #5   Title Patient will increase Berg Balance score to > 51/56 points to demonstrate decreased fall risk during functional activities.    Baseline 9/14: not assessed at evaluation due to patient's footwear; 10/14: 50/56 11/1: 52/56    Time 4    Period Weeks    Status Achieved                 Plan - 11/26/19 1609    Clinical Impression Statement Patient is progressing with functional mobility and strength tolerating increased close and open chained interventions as well as single limb stabilization interventions. Available active and passive knee flexion is improving however knee extension continued to be limited this session. Patient will benefit from skilled PT to address strength and ROM deficits and reduce pain in  order to maximize functional independence at home    Personal Factors and Comorbidities Age;Comorbidity 1;Time since onset of injury/illness/exacerbation    Comorbidities DM    Examination-Activity Limitations Bed Mobility;Bend;Squat;Locomotion Level;Transfers;Stairs;Stand;Sit    Examination-Participation Restrictions Community Activity;Cleaning;Driving;Shop;Yard Work    [employer] officervolving/Moderate complexity    Rehab Potential Good    PT Frequency 2x / week    PT Duration 4 weeks    PT Treatment/Interventions ADLs/Self Care Home Management;Cryotherapy;Stair training;Moist Heat;Therapeutic exercise;Balance training;Functional mobility training;Therapeutic activities;Neuromuscular re-education;Patient/family education;Manual techniques;Scar mobilization;Energy conservation;Dry needling;Taping;Manual lymph drainage;Gait training;Compression bandaging;Passive range of motion;Aquatic Therapy    PT Next Visit Plan increase ROM, midline acceptance, RLE strengthening, 6MWT    Consulted and Agree with Plan of Care Patient           Patient will benefit from skilled therapeutic intervention in order to improve the following deficits and impairments:  Abnormal gait, Decreased activity tolerance, Decreased endurance, Decreased range of motion, Decreased strength, Hypomobility, Increased fascial restricitons, Impaired sensation, Pain, Postural dysfunction, Difficulty walking, Decreased mobility, Improper body mechanics, Decreased balance  Visit Diagnosis: Stiffness of right knee, not elsewhere classified  Other abnormalities of gait and mobility  Muscle weakness (generalized)     Problem List There are no problems to display for this patient.  MaJanna ArchPT, DPT   11/26/2019, 4:10 PM  Co[place] REMorris VillageERVICES [address]dOkeechobee[place]hone: [phone] Fax:  [phone]: Virginia LalleyRN:  [id number] of Birth: 2/May 14, 1945

## 2019-11-28 ENCOUNTER — Ambulatory Visit: Payer: Medicare HMO

## 2019-11-28 ENCOUNTER — Other Ambulatory Visit: Payer: Self-pay

## 2019-11-28 DIAGNOSIS — R2689 Other abnormalities of gait and mobility: Secondary | ICD-10-CM

## 2019-11-28 DIAGNOSIS — M6281 Muscle weakness (generalized): Secondary | ICD-10-CM

## 2019-11-28 DIAGNOSIS — M25661 Stiffness of right knee, not elsewhere classified: Secondary | ICD-10-CM | POA: Diagnosis not present

## 2019-11-28 NOTE — Therapy (Signed)
[place] MAIN [facility] SERVICES [address] [place], [place], [address] Phone: [phone]   Fax:  [phone]  Physical Therapy Treatment/RECERT  Patient Details  Name: Virginia Hunt MRN: [id number] Date of Birth: [birth year] Referring Provider (PT): Kizzie Bane MD   Encounter Date: 11/28/2019   PT End of Session - 11/28/19 1714    Visit Number 13    Number of Visits 21    Date for PT Re-Evaluation 12/26/19    Authorization Type 3/10 Pn 11/1    PT Start Time 1515    PT Stop Time 1558    PT Time Calculation (min) 43 min    Equipment Utilized During Treatment Gait belt    Activity Tolerance Patient tolerated treatment well    Behavior During Therapy WFL for tasks assessed/performed           Past Medical History:  Diagnosis Date   Arthritis    Carpal tunnel syndrome    Diabetes mellitus without complication ([place])    pre diabetes   Diverticulitis    Fibromyalgia    Hypertension     Past Surgical History:  Procedure Laterality Date   ABDOMINAL HYSTERECTOMY     CHOLECYSTECTOMY     TONSILLECTOMY      There were no vitals filed for this visit.   Subjective Assessment - 11/28/19 1520    Subjective Patient reports she woke up with lateral knee pain this morning in RLE. Reports she is 90% back to normal but not quite there yet. Still having trouble getting out of car.    Pertinent History Pt reports R TKA on Jun 04, 2019, since has been working with [facility]. Pt reports prior to surgery had 'years' of knee pain with cortisone injection therapy for 10-12 years. Pt reports significant limitations in basic mobility. Pt reports having had PT in the past for insidious gait instability, shuffling, LOB. Pt reports this has since resolved. Patient attended OP physical therapy to address strength, ROM and balance impairments with self d/c 09/12/19, despite PT advisory to continue. Patient advised to return to physical therapy to regain  ROM in post-operative knee. Patient notes no problem lifting RLE at this point in rehabilitation process.    Limitations Walking;Lifting;Standing;House hold activities    Currently in Pain? Yes    Pain Score 6     Pain Location Knee    Pain Orientation Right    Pain Descriptors / Indicators Aching    Pain Type Chronic pain    Pain Onset More than a month ago    Pain Frequency Intermittent               Goals:  R knee ROM: flexion 111, extension -6 AROM, -4 PROM  6 min walk test-deferred due knee pain.  FOTO 68%   Treatment  Therex: NuStep Lvl 3 RPM> 80, 3 minutes Standing next to support surface: 6" step eccentric heel strike 10x each LE, painful to RLE with twisting motion noted, reduced to 4" step with no pain 12" step up/down 10x each LE [place] 4x length of // bars, no LOB, BUE support  half foam roller df/pf 30x with slow controlled eccentric.  walking hamstring stretch 4x length of // bars with focus on sequencing and muscle tissue lengthening  Manual: -prone hip flexor/knee flexion stretch with overpressure for optimal lengthening and angle6x 30 second holds,first two without towel under distal aspect of knee. -patella mobilization: inferior, superior, medial lateral grade I 10 seconds x 2  trials each direction -supine contract/relax against PT resistance for increasing knee flexion and extension 10x 10 second holds -hamstring lengthening of RLE, 30 seconds with leg on PT shoulder, distraction for pain relief -prone: roller to distal and lateral aspect of hamstring and IT band x 3 minutes   Pt educated throughout session about proper posture and technique with exercises. Improved exercise technique, movement at target joints, use of target muscles after min to mod verbal, visual, tactile cues   Patient is progressing towards functional goals, with some goals performed on 11/19/19 and the rest performed today. Patients range of motion is improving with  flexion at 111 and extension at -6 active and -4 passive. Patient is improving in gait mechanics and mobility. She will see surgeon next week for further instruction on plan of care. Patient will benefit from skilled PT to address strength and ROM deficits and reduce pain in order to maximize functional independence at home         PT Education - 11/28/19 1519    Education Details POC, goals, exercise technique, body mechanics    Person(s) Educated Patient    Methods Explanation;Demonstration;Tactile cues;Verbal cues    Comprehension Verbalized understanding;Returned demonstration;Verbal cues required;Tactile cues required            PT Short Term Goals - 11/28/19 1712      PT SHORT TERM GOAL #1   Title Patient will demonstrate improved R knee extension to -5 degrees in order to promote a more functional gait pattern.    Baseline 9/14: -8 degrees in supine 10/14: AROM -7 , PROM supine -7 11/1: extension AROM -6 PROM -5 11/10: -6 AROM    Time 2    Period Weeks    Status Partially Met    Target Date 12/03/19      PT SHORT TERM GOAL #2   Title Patient will demonstrate R knee flexion AROM of ~100 degrees in order to promote a more functional gait pattern.    Baseline 9/14: 96 degrees R knee in supine, 101 degrees PROM 10/14: seated AROM 95, PROM supine 97 11/1:  AROM 101 PROM 109 11/10: 111    Time 2    Period Weeks    Status Achieved    Target Date 12/03/19      PT SHORT TERM GOAL #3   Title Patient will be independent with HEP to improve RLE strength and mobility in order to perform functional ADLs without pain.    Baseline HEP not administered at evaluation 10/14: intermittent compliance 11/1: intermittent compliance    Time 2    Period Weeks    Status Partially Met    Target Date 12/03/19             PT Long Term Goals - 11/28/19 1711      PT LONG TERM GOAL #1   Title Patient will increase FOTO score to equal to or greater than 74 to demonstrate statistically  significant improvement in mobility and quality of life.    Baseline 9/14: 61% 10/14: 59 11/1: 67% 11/10: 68%    Time 4    Period Weeks    Status Partially Met    Target Date 12/26/19      PT LONG TERM GOAL #2   Title Patient will increase lower extremity functional scale to >60/80 todemonstrate improved functional mobility and increased tolerance with ADLs.    Baseline 9/14: 52/80 10/14: 57/80    Time 4    Period Weeks  Status Partially Met    Target Date 12/26/19      PT LONG TERM GOAL #3   Title Patient will increase 10 meter walk test to >1.7ms with improved midline awareness to improve gait speed for better community ambulation and to reduce fall risk.    Baseline 9/14: 1.249m without AD 10/14: 1.4 m/s    Time 4    Period Weeks    Status Achieved      PT LONG TERM GOAL #4   Title Patient will increase six minute walk test distance to >1400 with improved midline awareness for progression to community ambulator and improve gait ability    Baseline 9/14: not assessed at evaluation due to patient's footwear 10/14: 1230 ft    Time 4    Period Weeks    Status Partially Met    Target Date 12/26/19      PT LONG TERM GOAL #5   Title Patient will increase Berg Balance score to > 51/56 points to demonstrate decreased fall risk during functional activities.    Baseline 9/14: not assessed at evaluation due to patient's footwear; 10/14: 50/56 11/1: 52/56    Time 4    Period Weeks    Status Achieved                 Plan - 11/28/19 1714    Clinical Impression Statement Patient is progressing towards functional goals, with some goals performed on 11/19/19 and the rest performed today. Patients range of motion is improving with flexion at 111 and extension at -6 active and -4 passive. Patient is improving in gait mechanics and mobility. She will see surgeon next week for further instruction on plan of care. Patient will benefit from skilled PT to address strength and ROM deficits  and reduce pain in order to maximize functional independence at home    Personal Factors and Comorbidities Age;Comorbidity 1;Time since onset of injury/illness/exacerbation    Comorbidities DM    Examination-Activity Limitations Bed Mobility;Bend;Squat;Locomotion Level;Transfers;Stairs;Stand;Sit    Examination-Participation Restrictions Community Activity;Cleaning;Driving;Shop;Yard Work    StMerchant navy officervolving/Moderate complexity    Rehab Potential Good    PT Frequency 2x / week    PT Duration 4 weeks    PT Treatment/Interventions ADLs/Self Care Home Management;Cryotherapy;Stair training;Moist Heat;Therapeutic exercise;Balance training;Functional mobility training;Therapeutic activities;Neuromuscular re-education;Patient/family education;Manual techniques;Scar mobilization;Energy conservation;Dry needling;Taping;Manual lymph drainage;Gait training;Compression bandaging;Passive range of motion;Aquatic Therapy    PT Next Visit Plan increase ROM, midline acceptance, RLE strengthening, 6MWT    Consulted and Agree with Plan of Care Patient           Patient will benefit from skilled therapeutic intervention in order to improve the following deficits and impairments:  Abnormal gait, Decreased activity tolerance, Decreased endurance, Decreased range of motion, Decreased strength, Hypomobility, Increased fascial restricitons, Impaired sensation, Pain, Postural dysfunction, Difficulty walking, Decreased mobility, Improper body mechanics, Decreased balance  Visit Diagnosis: Stiffness of right knee, not elsewhere classified  Other abnormalities of gait and mobility  Muscle weakness (generalized)     Problem List There are no problems to display for this patient.  MaJanna ArchPT, DPT   11/28/2019, 5:15 PM  CoSalis[facility] MillsAIN RESt. W[facility] MorriltonERVICES [address]hone: [phone] Fax:  [phone]:  SaLynita GrosecloseRN: [id number] of Birth: 02/29/16/1947

## 2019-12-03 ENCOUNTER — Ambulatory Visit: Payer: Medicare HMO

## 2019-12-03 ENCOUNTER — Other Ambulatory Visit: Payer: Self-pay

## 2019-12-03 DIAGNOSIS — R262 Difficulty in walking, not elsewhere classified: Secondary | ICD-10-CM

## 2019-12-03 DIAGNOSIS — M25661 Stiffness of right knee, not elsewhere classified: Secondary | ICD-10-CM

## 2019-12-03 DIAGNOSIS — R2689 Other abnormalities of gait and mobility: Secondary | ICD-10-CM

## 2019-12-03 DIAGNOSIS — M6281 Muscle weakness (generalized): Secondary | ICD-10-CM

## 2019-12-03 NOTE — Therapy (Signed)
[place] MAIN [facility] SERVICES [address] [place], [place], [address] Phone: [phone]   Fax:  [phone]  Physical Therapy Treatment  Patient Details  Name: Virginia Hunt MRN: [id number] Date of Birth: 1945/05/17 Referring Provider (PT): Kizzie Bane MD   Encounter Date: 12/03/2019   PT End of Session - 12/04/19 1002    Visit Number 14    Number of Visits 21    Date for PT Re-Evaluation 12/26/19    Authorization Type 4/10 Pn 11/1    PT Start Time 1524    PT Stop Time 1559    PT Time Calculation (min) 35 min    Equipment Utilized During Treatment Gait belt    Activity Tolerance Patient tolerated treatment well    Behavior During Therapy WFL for tasks assessed/performed           Past Medical History:  Diagnosis Date   Arthritis    Carpal tunnel syndrome    Diabetes mellitus without complication ([place])    pre diabetes   Diverticulitis    Fibromyalgia    Hypertension     Past Surgical History:  Procedure Laterality Date   ABDOMINAL HYSTERECTOMY     CHOLECYSTECTOMY     TONSILLECTOMY      There were no vitals filed for this visit.   Subjective Assessment - 12/03/19 1528    Subjective Patient goes to surgeon tomorrow. Reports having a good weekend. No falls or LOB since last session.    Pertinent History Pt reports R TKA on Jun 04, 2019, since has been working with [facility]. Pt reports prior to surgery had 'years' of knee pain with cortisone injection therapy for 10-12 years. Pt reports significant limitations in basic mobility. Pt reports having had PT in the past for insidious gait instability, shuffling, LOB. Pt reports this has since resolved. Patient attended OP physical therapy to address strength, ROM and balance impairments with self d/c 09/12/19, despite PT advisory to continue. Patient advised to return to physical therapy to regain ROM in post-operative knee. Patient notes no problem lifting RLE at this point  in rehabilitation process.    Limitations Walking;Lifting;Standing;House hold activities    Currently in Pain? Yes    Pain Score 2     Pain Location Knee    Pain Orientation Right    Pain Descriptors / Indicators Aching    Pain Type Chronic pain    Pain Onset More than a month ago    Pain Frequency Intermittent                   Manual:   -prone hip flexor/knee flexion stretch with overpressure for optimal lengthening and angle 6x 30 second holds, first two without towel under distal aspect of knee.  -patella mobilization: inferior, superior, medial lateral grade I 10 seconds x 2 trials each direction -supine contract/relax against PT resistance for increasing knee flexion and extension 10x 10 second holds -hamstring lengthening of RLE, 30 seconds with leg on PT shoulder, distraction for pain relief  -supine knee extension overpressure 4x 20 second holds     Therex:   NuStep Lvl 3 RPM> 80, 3 minutes   Standing next to support surface: Sumo squat with chair behind for support 12x Straight leg flexion with cue for quad contraction 12x each LE Hip extension pulses 15x each LE, more challenging to stabilize on RLE Stair hamstring stretch : knee extension stretch 2x 60 seconds Stair R knee flexion : 60 seconds  Standing heel toe raises 20x  -ambulate 50 ft with focus on heel strike and toe off: patient has "kick" mechanism of throwing limb forward to propel it; decreased with cueing and extrinsic combined with intrinsic cueing.    Seated: Sit to stand RLE only on raised plinth table with UE support from PT x 5 STS 15 from lowered table with focus on equal weight shift.    Pt educated throughout session about proper posture and technique with exercises. Improved exercise technique, movement at target joints, use of target muscles after min to mod verbal, visual, tactile cues                     PT Education - 12/03/19 1529    Education Details body  mechanics, exercise technique    Person(s) Educated Patient    Methods Explanation;Demonstration;Tactile cues;Verbal cues    Comprehension Verbalized understanding;Returned demonstration;Verbal cues required;Tactile cues required            PT Short Term Goals - 11/28/19 1712      PT SHORT TERM GOAL #1   Title Patient will demonstrate improved R knee extension to -5 degrees in order to promote a more functional gait pattern.    Baseline 9/14: -8 degrees in supine 10/14: AROM -7 , PROM supine -7 11/1: extension AROM -6 PROM -5 11/10: -6 AROM    Time 2    Period Weeks    Status Partially Met    Target Date 12/03/19      PT SHORT TERM GOAL #2   Title Patient will demonstrate R knee flexion AROM of ~100 degrees in order to promote a more functional gait pattern.    Baseline 9/14: 96 degrees R knee in supine, 101 degrees PROM 10/14: seated AROM 95, PROM supine 97 11/1:  AROM 101 PROM 109 11/10: 111    Time 2    Period Weeks    Status Achieved    Target Date 12/03/19      PT SHORT TERM GOAL #3   Title Patient will be independent with HEP to improve RLE strength and mobility in order to perform functional ADLs without pain.    Baseline HEP not administered at evaluation 10/14: intermittent compliance 11/1: intermittent compliance    Time 2    Period Weeks    Status Partially Met    Target Date 12/03/19             PT Long Term Goals - 11/28/19 1711      PT LONG TERM GOAL #1   Title Patient will increase FOTO score to equal to or greater than 74 to demonstrate statistically significant improvement in mobility and quality of life.    Baseline 9/14: 61% 10/14: 59 11/1: 67% 11/10: 68%    Time 4    Period Weeks    Status Partially Met    Target Date 12/26/19      PT LONG TERM GOAL #2   Title Patient will increase lower extremity functional scale to >60/80 todemonstrate improved functional mobility and increased tolerance with ADLs.    Baseline 9/14: 52/80 10/14: 57/80    Time  4    Period Weeks    Status Partially Met    Target Date 12/26/19      PT LONG TERM GOAL #3   Title Patient will increase 10 meter walk test to >1.70ms with improved midline awareness to improve gait speed for better community ambulation and to reduce fall risk.  Baseline 9/14: 1.76ms without AD 10/14: 1.4 m/s    Time 4    Period Weeks    Status Achieved      PT LONG TERM GOAL #4   Title Patient will increase six minute walk test distance to >1400 with improved midline awareness for progression to community ambulator and improve gait ability    Baseline 9/14: not assessed at evaluation due to patient's footwear 10/14: 1230 ft    Time 4    Period Weeks    Status Partially Met    Target Date 12/26/19      PT LONG TERM GOAL #5   Title Patient will increase Berg Balance score to > 51/56 points to demonstrate decreased fall risk during functional activities.    Baseline 9/14: not assessed at evaluation due to patient's footwear; 10/14: 50/56 11/1: 52/56    Time 4    Period Weeks    Status Achieved                 Plan - 12/04/19 1006    Clinical Impression Statement Patient session limited due to late arrival. Will see physician tomorrow to determine future POC. At this time patient ROM is improving as well as ability to weight accept on post surgical limb. Patient does require progressive lengthening and warming up prior to optimal flexion and extension however is aware of this and agreeable to perform movements prior to exercise. Patient will benefit from skilled PT to address strength and ROM deficits and reduce pain in order to maximize functional independence at home    Personal Factors and Comorbidities Age;Comorbidity 1;Time since onset of injury/illness/exacerbation    Comorbidities DM    Examination-Activity Limitations Bed Mobility;Bend;Squat;Locomotion Level;Transfers;Stairs;Stand;Sit    Examination-Participation Restrictions Community  Activity;Cleaning;Driving;Shop;Yard Work    SMerchant navy officerEvolving/Moderate complexity    Rehab Potential Good    PT Frequency 2x / week    PT Duration 4 weeks    PT Treatment/Interventions ADLs/Self Care Home Management;Cryotherapy;Stair training;Moist Heat;Therapeutic exercise;Balance training;Functional mobility training;Therapeutic activities;Neuromuscular re-education;Patient/family education;Manual techniques;Scar mobilization;Energy conservation;Dry needling;Taping;Manual lymph drainage;Gait training;Compression bandaging;Passive range of motion;Aquatic Therapy    PT Next Visit Plan increase ROM, midline acceptance, RLE strengthening, 6MWT    Consulted and Agree with Plan of Care Patient           Patient will benefit from skilled therapeutic intervention in order to improve the following deficits and impairments:  Abnormal gait, Decreased activity tolerance, Decreased endurance, Decreased range of motion, Decreased strength, Hypomobility, Increased fascial restricitons, Impaired sensation, Pain, Postural dysfunction, Difficulty walking, Decreased mobility, Improper body mechanics, Decreased balance  Visit Diagnosis: Stiffness of right knee, not elsewhere classified  Other abnormalities of gait and mobility  Muscle weakness (generalized)  Difficulty in walking, not elsewhere classified     Problem List There are no problems to display for this patient.  MJanna Hunt PT, DPT   12/04/2019, 10:07 AM  [facility]MAIN RNew [facility] LLCSERVICES [address] DriveRFalls Creek N[place] [address]Phone: [phone]  Fax:  [phone] Name: SRaylei LosurdoMRN: [id number] of Birth: 206-23-1947

## 2019-12-05 ENCOUNTER — Ambulatory Visit: Payer: Medicare HMO

## 2019-12-05 ENCOUNTER — Other Ambulatory Visit: Payer: Self-pay

## 2019-12-10 ENCOUNTER — Ambulatory Visit: Payer: Medicare HMO
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
## 2021-09-28 ENCOUNTER — Emergency Department: Payer: Medicare HMO

## 2021-09-28 ENCOUNTER — Emergency Department
Admission: EM | Admit: 2021-09-28 | Discharge: 2021-09-29 | Disposition: A | Discharge status: Home / Self Care | Payer: Medicare HMO | Attending: Emergency Medicine | Admitting: Emergency Medicine

## 2021-09-28 DIAGNOSIS — R509 Fever, unspecified: Secondary | ICD-10-CM | POA: Insufficient documentation

## 2021-09-28 DIAGNOSIS — M791 Myalgia, unspecified site: Secondary | ICD-10-CM | POA: Diagnosis present

## 2021-09-28 DIAGNOSIS — E119 Type 2 diabetes mellitus without complications: Secondary | ICD-10-CM | POA: Insufficient documentation

## 2021-09-28 DIAGNOSIS — U071 COVID-19: Secondary | ICD-10-CM | POA: Insufficient documentation

## 2021-09-28 DIAGNOSIS — I10 Essential (primary) hypertension: Secondary | ICD-10-CM | POA: Insufficient documentation

## 2021-09-28 LAB — COMPREHENSIVE METABOLIC PANEL
ALT: 17 U/L (ref 0–44)
AST: 25 U/L (ref 15–41)
Albumin: 4.4 g/dL (ref 3.5–5.0)
Alkaline Phosphatase: 55 U/L (ref 38–126)
Anion gap: 9 (ref 5–15)
BUN: 30 mg/dL — ABNORMAL HIGH (ref 8–23)
CO2: 26 mmol/L (ref 22–32)
Calcium: 9.7 mg/dL (ref 8.9–10.3)
Chloride: 105 mmol/L (ref 98–111)
Creatinine, Ser: 1.72 mg/dL — ABNORMAL HIGH (ref 0.44–1.00)
GFR, Estimated: 30 mL/min — ABNORMAL LOW (ref >60)
Glucose, Bld: 146 mg/dL — ABNORMAL HIGH (ref 70–99)
Potassium: 3.7 mmol/L (ref 3.5–5.1)
Sodium: 140 mmol/L (ref 135–145)
Total Bilirubin: 1.1 mg/dL (ref 0.3–1.2)
Total Protein: 8.1 g/dL (ref 6.5–8.1)

## 2021-09-28 LAB — CBC WITH DIFFERENTIAL/PLATELET
Abs Immature Granulocytes: 0.08 10*3/uL — ABNORMAL HIGH (ref 0.00–0.07)
Basophils Absolute: 0 10*3/uL (ref 0.0–0.1)
Basophils Relative: 0 %
Eosinophils Absolute: 0 10*3/uL (ref 0.0–0.5)
Eosinophils Relative: 0 %
HCT: 43.6 % (ref 36.0–46.0)
Hemoglobin: 14.3 g/dL (ref 12.0–15.0)
Immature Granulocytes: 1 %
Lymphocytes Relative: 9 %
Lymphs Abs: 0.7 10*3/uL (ref 0.7–4.0)
MCH: 28.9 pg (ref 26.0–34.0)
MCHC: 32.8 g/dL (ref 30.0–36.0)
MCV: 88.1 fL (ref 80.0–100.0)
Monocytes Absolute: 0.7 10*3/uL (ref 0.1–1.0)
Monocytes Relative: 8 %
Neutro Abs: 6.8 10*3/uL (ref 1.7–7.7)
Neutrophils Relative %: 82 %
Platelets: 212 10*3/uL (ref 150–400)
RBC: 4.95 MIL/uL (ref 3.87–5.11)
RDW: 14.1 % (ref 11.5–15.5)
WBC: 8.3 10*3/uL (ref 4.0–10.5)
nRBC: 0 % (ref 0.0–0.2)

## 2021-09-28 LAB — URINALYSIS, ROUTINE W REFLEX MICROSCOPIC
Bilirubin Urine: NEGATIVE
Glucose, UA: NEGATIVE mg/dL
Hgb urine dipstick: NEGATIVE
Ketones, ur: NEGATIVE mg/dL
Leukocytes,Ua: NEGATIVE
Nitrite: NEGATIVE
Protein, ur: NEGATIVE mg/dL
Specific Gravity, Urine: 1.015 (ref 1.005–1.030)
pH: 5 (ref 5.0–8.0)

## 2021-09-28 LAB — LACTIC ACID, PLASMA: Lactic Acid, Venous: 1.5 mmol/L (ref 0.5–1.9)

## 2021-09-28 LAB — SARS CORONAVIRUS 2 BY RT PCR: SARS Coronavirus 2 by RT PCR: POSITIVE — AB

## 2021-09-28 LAB — PROCALCITONIN: Procalcitonin: <0.1 ng/mL

## 2021-09-28 MED ORDER — OXYCODONE HCL 5 MG PO TABS
5.0000 mg | ORAL_TABLET | Freq: Once | ORAL | Status: AC
  Administered 2021-09-28: 5 mg via ORAL
  Filled 2021-09-28: qty 1

## 2021-09-28 MED ORDER — LACTATED RINGERS IV BOLUS
1000.0000 mL | Freq: Once | INTRAVENOUS | Status: AC
  Administered 2021-09-28: 1000 mL via INTRAVENOUS

## 2021-09-28 MED ORDER — ACETAMINOPHEN 500 MG PO TABS
1000.0000 mg | ORAL_TABLET | Freq: Once | ORAL | Status: AC
  Administered 2021-09-28: 1000 mg via ORAL
  Filled 2021-09-28: qty 2

## 2021-09-28 NOTE — ED Provider Notes (Signed)
[facility] Provider Note    None    (approximate)   History   No chief complaint on file.   HPI  Virginia Hunt is a 76 y.o. female  with pmh HTN, fibromyalgia, DM who presents with diffuse body pain.  Patient tells me that she has longstanding acute pain.  It is all over her body including her back down her arms legs knees.  She takes gabapentin for this.  She is also urinating very frequently and going all the time and urinating on the floor.  This came to ahead today and she felt very distressed called her primary doctor and said that she had thoughts of "ending it all".  However she tells me that she was just frustrated she has no intent on harming herself she is Catholic she would never do this that she was go to hell.  She tells me that over the last several days she has felt very cold and has just felt more unwell.  Her pain however is chronic and not new.  She denies dysuria just urinary frequency which again has been over several weeks.  Denies abdominal pain vomiting.  Does have some diarrhea that is chronic.  She has some nasal congestion no sore throat no cough no shortness of breath.  Did not know she had fever at home.  She is vaccinated against COVID.  Denies sick contacts.     Past Medical History:  Diagnosis Date  . Arthritis   . Carpal tunnel syndrome   . Diabetes mellitus without complication (HCC)    pre diabetes  . Diverticulitis   . Fibromyalgia   . Hypertension     There are no problems to display for this patient.    Physical Exam  Triage Vital Signs: ED Triage Vitals [09/28/21 1824]  Enc Vitals Group     BP      Pulse      Resp      Temp      Temp src      SpO2 100 %     Weight      Height      Head Circumference      Peak Flow      Pain Score      Pain Loc      Pain Edu?      Excl. in GC?     Most recent vital signs: Vitals:   09/28/21 1824  SpO2: 100%     General: Awake, no distress.  CV:  Good  peripheral perfusion.  No peripheral edema Resp:  Normal effort.  Lungs are clear Abd:  No distention.  Abdomen is soft nontender Neuro:             Awake, Alert, Oriented x 3  Other:  Patient has no focal swelling or tenderness of the bilateral knees, able to range, not warm and not red Midline and paraspinal lumbar tenderness 5 out of 5 strength bilateral lower extremities  She cervical lymphadenopathy bilaterally, no meningismus  ED Results / Procedures / Treatments  Labs (all labs ordered are listed, but only abnormal results are displayed) Labs Reviewed - No data to display   EKG  EKG interpretation performed by myself: NSR, nml axis, nml intervals, no acute ischemic changes    RADIOLOGY I reviewed and interpreted the CXR which does not show any acute cardiopulmonary process    PROCEDURES:  Critical Care performed: No  Procedures  The patient is on  the cardiac monitor to evaluate for evidence of arrhythmia and/or significant heart rate changes.   MEDICATIONS ORDERED IN ED: Medications - No data to display   IMPRESSION / MDM / ASSESSMENT AND PLAN / ED COURSE  I reviewed the triage vital signs and the nursing notes.                              Patient's presentation is most consistent with acute presentation with potential threat to life or bodily function.  Differential diagnosis includes, but is not limited to, viral illness including COVID-19, influenza, bacterial pneumonia, UTI, sepsis  Patient is a 76 year old female with chronic pain fibromyalgia presents because increasing pain and feeling generally unwell over the last couple days.  Her pain is chronic and is rather diffuse involving her knees back primarily.  This is not new but she is just felt more unwell over the last couple days.  Has also had some nasal congestion and feeling very cold.  Denies shortness of breath or other GI symptoms.  She messaged her primary doctor with some suggestion of passive  SI saying she wanted to "end it all" however she is not actively suicidal for me; she tells me she is just very frustrated that she is Catholic would never harm herself.  She is notably febrile to 103.3 and tachycardic and tachypneic.  I suspect that this is why she is feeling so bad.  I suspect it is more viral and will send COVID test.  We will send urinalysis and perform chest x-ray.  Check basic labs including lactate and blood cultures.  Disposition pending work-up.  Patient's labs are reassuring.  She has no leukocytosis procalcitonin is negative.  She has a mild AKI creatinine 1.7, slight around 1.45.  Lactate is negative.  Urinalysis is not consistent with infection.  Patient's heart rate improved and her fever defervesced with Tylenol and fluids.  COVID test was notably positive which I think explains her symptoms.  She is satting 100% on room air with minimal respiratory symptoms and chest x-ray does not suggest pneumonia.  Patient would like to go home and I think with her reassuring work-up this is reasonable.  Will discharge.   Clinical Course as of 09/28/21 2310  Mon Sep 28, 2021  2309 SARS Coronavirus 2 by RT PCR(!): POSITIVE [KM]    Clinical Course User Index [KM] Georga Hacking, MD     FINAL CLINICAL IMPRESSION(S) / ED DIAGNOSES   Final diagnoses:  None     Rx / DC Orders   ED Discharge Orders     None        Note:  This document was prepared using Dragon voice recognition software and may include unintentional dictation errors.

## 2021-09-28 NOTE — ED Triage Notes (Signed)
Pt BIB [facility] from home. Per EMS, pt called family member and made "SI-type statements" and the family member called 911 to have patient transported to hospital.   Upon arrival to ED, pt denies SI/HI. Pt does report dizziness, headache, generally feeling unwell X 3 days. Pt reports knee surgery about a year ago with complications since then, including ongoing pain. Pt states her knee pain exacerbated her emotions today and she sent a message to her PCP about "ending it," but she does not truly want to harm herself.

## 2021-09-28 NOTE — ED Notes (Signed)
Pt also reports urinary frequency X 1 week. Pt provided with cup to give urine sample.

## 2021-09-28 NOTE — Discharge Instructions (Addendum)
Your COVID test is positive.  Please continue to take Tylenol for your body aches.  Please make sure you are staying hydrated.  Please follow-up with your primary care doctor.  If you develop worsening confusion or shortness of breath please return to emergency department.

## 2021-09-28 NOTE — ED Notes (Signed)
Pt ambulated to BR with steady gait.

## 2021-09-30 LAB — URINE CULTURE

## 2021-10-03 LAB — CULTURE, BLOOD (ROUTINE X 2)
Culture: NO GROWTH
Culture: NO GROWTH
Special Requests: ADEQUATE
Special Requests: ADEQUATE

## 2023-08-23 ENCOUNTER — Other Ambulatory Visit (INDEPENDENT_AMBULATORY_CARE_PROVIDER_SITE_OTHER): Payer: Self-pay

## 2023-08-23 ENCOUNTER — Encounter: Payer: Self-pay | Admitting: Orthopaedic Surgery

## 2023-08-23 ENCOUNTER — Telehealth: Payer: Self-pay

## 2023-08-23 ENCOUNTER — Ambulatory Visit: Admitting: Orthopaedic Surgery

## 2023-08-23 DIAGNOSIS — M1712 Unilateral primary osteoarthritis, left knee: Secondary | ICD-10-CM | POA: Diagnosis not present

## 2023-08-23 NOTE — Progress Notes (Signed)
 Office Visit Note   Patient: Virginia Hunt           Date of Birth: Aug 18, 1945           MRN: [id number] Visit Date: 08/23/2023              Requested by: Jacquelyn Jaynie Player, PA-C [address] [place],  [place] [address] PCP: Jacquelyn Jaynie Player, PA-C   Assessment & Plan: Visit Diagnoses:  1. Primary osteoarthritis of left knee     Plan: History of Present Illness Virginia Hunt is a 78 year old female who presents for evaluation of left knee replacement surgery. She is accompanied by her daughter. She was referred by a woman who recommended Dr. Jerri after noticing her pain and difficulty walking.  She experiences severe knee pain for the past six to eight months, progressively worsening. She was initially scheduled for knee replacement at [facility], but it was postponed due to an unprovoked blood clot. She underwent a right knee replacement four years ago at [facility] with ongoing pain despite the surgery. Previous treatments include cortisone shots and gel injections, but significant pain persists.  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 She is on long-term warfarin, managed by Stephens Done, with plans to bridge with Lovenox for the surgery. She has been cleared for surgery from a hematological standpoint.  She has a history of waking up during surgeries and plans to discuss sedation with the anesthesiologist for her upcoming knee replacement.  Physical Exam MUSCULOSKELETAL: Left knee lacks 8-10 degrees of full extension, flexion to 92-93 degrees, no tenderness along sides.  No joint effusion  Assessment and Plan Left knee osteoarthritis Chronic osteoarthritis with severe pain and functional limitation. Surgery recommended due to significant impairment. - Coordinate with Dr. Done for perioperative anticoagulation management, including stopping warfarin and bridging with Lovenox. - Schedule total  knee arthroplasty within four to six weeks, pending hematology clearance. - Provide contact information for surgery scheduler, Marval, to coordinate surgery date. - Discuss anesthesia concerns with anesthesiologist, considering her history of waking during surgery.  Impression is severe left knee degenerative joint disease secondary to Osteoarthritis.  Patient has attempted conservative treatment for at least 6 consecutive weeks within the past 12 weeks, including but not limited to physical therapy, home exercise program, NSAIDs, activity modification, and/or corticosteroid injections. Despite these efforts, symptoms have not improved or have worsened. Conservative measures have been deemed unsuccessful at this time. After a detailed discussion covering diagnosis and treatment options--including the risks, benefits, alternatives, and potential complications of surgical and nonsurgical management--the patient elected to proceed with surgery  Anticoagulants: warfarin daily Postop anticoagulation: resume warfarin Diabetic: No  Nickel allergy: No Prior DVT/PE: Yes Tobacco use: No Clearances needed for surgery: Nena Done, hematology at [facility] Anticipated discharge dispo: Home   Follow-Up Instructions: No follow-ups on file.   Orders:  Orders Placed This Encounter  Procedures   XR KNEE 3 VIEW LEFT   No orders of the defined types were placed in this encounter.     Procedures: No procedures performed   Clinical Data: No additional findings.   Subjective: Chief Complaint  Patient presents with   Left Knee - Pain    HPI  Review of Systems  Constitutional: Negative.   HENT: Negative.    Eyes: Negative.   Respiratory: Negative.    Cardiovascular: Negative.   Endocrine: Negative.   Musculoskeletal: Negative.   Neurological: Negative.  Hematological: Negative.   Psychiatric/Behavioral: Negative.    All other systems reviewed and are negative.    Objective: Vital  Signs: There were no vitals taken for this visit.  Physical Exam Vitals and nursing note reviewed.  Constitutional:      Appearance: She is well-developed.  HENT:     Head: Atraumatic.     Nose: Nose normal.  Eyes:     Extraocular Movements: Extraocular movements intact.  Cardiovascular:     Pulses: Normal pulses.  Pulmonary:     Effort: Pulmonary effort is normal.  Abdominal:     Palpations: Abdomen is soft.  Musculoskeletal:     Cervical back: Neck supple.  Skin:    General: Skin is warm.     Capillary Refill: Capillary refill takes less than 2 seconds.  Neurological:     Mental Status: She is alert. Mental status is at baseline.  Psychiatric:        Behavior: Behavior normal.        Thought Content: Thought content normal.        Judgment: Judgment normal.     Ortho Exam  Specialty Comments:  No specialty comments available.  Imaging: No results found.   PMFS History: There are no active problems to display for this patient.  Past Medical History:  Diagnosis Date   Arthritis    Carpal tunnel syndrome    Diabetes mellitus without complication (HCC)    pre diabetes   Diverticulitis    Fibromyalgia    Hypertension     Family History  Problem Relation Age of Onset   Stroke Father    COPD Father     Past Surgical History:  Procedure Laterality Date   ABDOMINAL HYSTERECTOMY     CHOLECYSTECTOMY     TONSILLECTOMY     Social History   Occupational History   Not on file  Tobacco Use   Smoking status: Former   Smokeless tobacco: Never  Vaping Use   Vaping status: Never Used  Substance and Sexual Activity   Alcohol use: No   Drug use: No   Sexual activity: Not on file

## 2023-08-23 NOTE — Telephone Encounter (Signed)
 Surgical clearance form sent to hematology--Virginia Hunt. Fax #[phone] Ph #[phone]

## 2023-08-30 NOTE — Progress Notes (Signed)
 Established Patient Visit    Reason for Visit:  Chief Complaint  Patient presents with  . Follow-up   Date of Service: 08/30/2023 Date of Birth: 1945-04-08 PCP: Marcille Jaynie Player, PA   History of Present Illness: Ms. Deady is a 78 y.o.female patient who has Hypertension associated with diabetes (CMS/HHS-HCC); Diabetes mellitus type II, controlled (CMS/HHS-HCC); Kidney donor; OSA (obstructive sleep apnea); Allergic rhinitis; Fibromyalgia; Asthma (HHS-HCC); Right lateral abdominal pain; History of food anaphylaxis; Thyroid nodule; Abnormal colonoscopy; Myopia of both eyes; Posterior vitreous detachment of both eyes; Central centrifugal scarring alopecia; GERD (gastroesophageal reflux disease); Osteoarthritis, knee; Intertrigo; Lactose intolerance; Diverticulosis of left colon; CKD (chronic kidney disease) stage 3, GFR 30-59 ml/min (CMS/HHS-HCC); Bilateral carpal tunnel syndrome; Spondylosis of cervical region without myelopathy or radiculopathy; Spondylosis of lumbar region without myelopathy or radiculopathy; Spondylolisthesis of lumbar region; Congenital kyphosis of thoracic region; Trigger middle finger of right hand; DNR (do not resuscitate); Hallux rigidus of left foot; Hallux rigidus of right foot; Degenerative joint disease of ankle and foot, left; Degenerative joint disease of ankle and foot, right; Type 2 diabetes mellitus without complication (CMS/HHS-HCC); Primary osteoarthritis of right knee; Chronic iritis of left eye; Cataract of both eyes; Cerebrovascular small vessel disease; Obesity (BMI 30-39.9), unspecified; Overactive bladder; Preoperative evaluation of a medical condition to rule out surgical contraindications (TAR required); Acute right ankle pain; Aftercare following right knee joint replacement surgery; Presence of artificial knee joint, right; Neuropathy associated with MGUS (CMS/HHS-HCC); Flexion contracture of right knee; Numbness and tingling in left hand; Intervertebral  disc disorder with radiculopathy of lumbar region; Spinal stenosis of cervical region; Acute right-sided low back pain with sciatica; Spinal stenosis of lumbosacral region; Groin pain, left; Osteopenia; Chronic, continuous use of opioids; Sensory polyneuropathy; Chest tightness; DOE (dyspnea on exertion); Other pulmonary embolism without acute cor pulmonale (CMS/HHS-HCC); Primary osteoarthritis of left knee; and Acute deep vein thrombosis (DVT) of left lower extremity (CMS/HHS-HCC) on their problem list.  She returns today in follow-up.  She has a history of hypertension and left ventricular hypertrophy related to blood pressure issues.  Blood pressures have been under relatively good control recently.  She does not check her blood pressures at home but sees doctors regularly.  She presented to [facility] in May and was found to have a DVT with saddle pulmonary embolism.  Echocardiogram showed biventricular function normal with no signs of pressure overload.  She has been taking warfarin since that time, not on a DOAC due to cost.  She has had no bleeding issues and reports no chest pain or shortness of breath.  She has been very anxious to get her left knee total arthroplasty as she is really struggling to get around.  Somehow she continues to work up to 28 hours a week at [employer] and continues to get nearly 10,000 steps per day but watching her walk is very difficult for her.  She reports no exertional chest pain or dyspnea.  Her edema has been chronic and really not any better or worse since her DVT and treatment.  Her ECG on May 7 shows sinus bradycardia with LVH voltage.  She was actually seen about a month ago by Dr. Rymer for preop cardiovascular evaluation, she felt the patient was optimized and not at high risk for cardiovascular complications but recommended hematology weigh in given the recent clot.  Indeed, hematology has approved her to move forward with surgery with instructions provided for Lovenox bridge.   She will have this done at [facility] in  [place].  Pressure by my check today in the right arm is 130/70. Past Medical and Surgical History    Past Medical History:  Diagnosis Date  . ACE inhibitor-aggravated angioedema    lisinopril  . Asthma without status asthmaticus (HHS-HCC)   . Awareness under anesthesia    Noted during hysterectomy-toward end of case, noted during foot surgery as well   . Chronic kidney disease   . Diabetes mellitus type 2, uncomplicated (CMS/HHS-HCC)   . DVT (deep venous thrombosis) (CMS/HHS-HCC)   . Fibromyalgia   . GERD (gastroesophageal reflux disease)   . History of cancer [id number]  . Hypertension   . Kidney disease   . Multiple myeloma (CMS/HHS-HCC)   . Neuropathy   . Osteoarthritis   . Plantar fasciitis, bilateral   . 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 Sleep apnea   . Thyroid disease    thyroid nodules, no meds taken  . Wears glasses    Past Surgical History She has a past surgical history that includes Cholecystectomy (01/19/2007); Nephrectomy living donor (Left, 01/18/1998); Salpingo Oophorectomy (Bilateral, 01/19/1983); Hysterectomy (01/19/1983); Tubal ligation (01/19/1968); Tonsillectomy (01/18/1973); left foot surgery; right foot surgery; colonoscopy (N/A, 06/29/2016); incision tendon sheath for trigger finger (Right, 11/23/2016); egd (N/A, 08/18/2018); arthroplasty total knee (Right, 06/04/2019); Joint replacement (2021); Knee arthroscopy (2021); extraction cataract extracapsular w/insertion intraocular prosthesis (Left, 08/25/2022); and extraction cataract extracapsular w/insertion intraocular prosthesis (Right, 09/08/2022).   Medications and Allergies    Current Outpatient Medications  Medication Sig Dispense Refill  . albuterol MDI, PROVENTIL, VENTOLIN, PROAIR, HFA 90 mcg/actuation inhaler Inhale 2 inhalations into the lungs every 6 (six) hours as needed for Wheezing 1 each 11  . betamethasone dipropionate  (DIPROSONE) 0.05 % ointment Apply to scalp three times per week. Don't use on face. 15 g 3  . cyclobenzaprine (FLEXERIL) 5 MG tablet TAKE 1 TABLET(5 MG) BY MOUTH THREE TIMES DAILY AS NEEDED FOR MUSCLE SPASMS 30 tablet 3  . EPINEPHrine (EPIPEN) 0.3 mg/0.3 mL auto-injector Inject 0.3 mLs (0.3 mg total) into the muscle at bedtime as needed 2 Pen 2  . fluticasone propionate (FLONASE) 50 mcg/actuation nasal spray PLACE 1 SPRAY IN EACH NOSTRIL TWICE DAILY 16 g 11  . loratadine (CLARITIN) 10 mg tablet Take 10 mg by mouth once daily as needed    . losartan (COZAAR) 100 MG tablet Take 1 tablet (100 mg total) by mouth once daily 90 tablet 3  . ONETOUCH VERIO TEST STRIPS test strip by XX route once daily Use as instructed. 100 each 12  . oxyCODONE  (ROXICODONE ) 5 MG immediate release tablet Take 1 tablet (5 mg total) by mouth 2 (two) times daily as needed for Pain 60 tablet 0  . oxyCODONE  (ROXICODONE ) 5 MG immediate release tablet Take 1 tablet (5 mg total) by mouth 2 (two) times daily as needed for Pain 60 tablet 0  . oxyCODONE  (ROXICODONE ) 5 MG immediate release tablet Take 1 tablet (5 mg total) by mouth 2 (two) times daily as needed for Pain 60 tablet 0  . pregabalin (LYRICA) 25 MG capsule Take 1 capsule (25 mg total) by mouth 3 (three) times daily 90 capsule 5  . triamcinolone 0.1 % cream Apply topically 2 (two) times daily as needed Under breasts 30 g 5  . warfarin (COUMADIN) 7.5 MG tablet Take 1/2 tablet on Mondays and 1 tablet all other days of the week at bedtime OR AS DIRECTED BY CLINIC 30 tablet 5  . BD PRECISIONGLIDE  25 gauge x 1 Ndle  (Patient not taking: Reported on 08/30/2023)    . naloxone (NARCAN) 4 mg/actuation nasal spray 1 spray (4 mg total) by Nasal route once as needed (if not breathing or overdose is suspected.) for up to 1 dose Give 2nd dose in 5-10 min if not responding or if sx return for up to 1 dose. 2 each 1   Current Facility-Administered Medications  Medication Dose Route Frequency  Provider Last Rate Last Admin  . cyanocobalamin (VITAMIN B12) injection 1,000 mcg  1,000 mcg Intramuscular Q90 Days Mandall, Maribeth Terasa, PA   1,000 mcg at 04/04/23 1612   Allergies: Acetaminophen , Amlodipine, Aspirin, Lisinopril, Morphine, Nsaids (non-steroidal anti-inflammatory drug), Nut - unspecified, Procardia [nifedipine], and Tomato  Social and Family History    Social History   Tobacco Use  . Smoking status: Former    Current packs/day: 0.00    Average packs/day: 1 pack/day for 15.0 years (15.0 ttl pk-yrs)    Types: Cigarettes    Start date: 01/18/1970    Quit date: 01/18/1985    Years since quitting: 38.6  . Smokeless tobacco: Never  Vaping Use  . Vaping status: Never Used  Substance Use Topics  . Alcohol use: Never  . Drug use: No   Family History: family history includes Anxiety in her sister; Breast cancer in her sister; COPD in her mother; Coronary Artery Disease (Blocked arteries around heart) (age of onset: 22) in her father; Diabetes type II in her brother, brother, brother, father, sister, sister, and sister; Glaucoma in her mother; Gout in her mother and sister; Heart failure in her mother; High blood pressure (Hypertension) in her brother, brother, brother, father, mother, sister, sister, and sister; Hyperlipidemia (Elevated cholesterol) in her sister, sister, and sister; Kidney disease in her brother; Kidney failure in her brother; Osteoarthritis in her sister; Reflux disease in her brother, sister, sister, and sister; Rheum arthritis in her sister; Sleep apnea in her brother, sister, sister, and sister; Stroke in her father; Ulcers in her father.  Physical Examination  BP (!) 140/80   Pulse 74   Wt (!) 106.6 kg (235 lb)   LMP  (LMP Unknown)   SpO2 98%   BMI 39.30 kg/m  Physical Exam Vitals reviewed.  Constitutional:      General: She is not in acute distress.    Appearance: Normal appearance. She is not toxic-appearing.  HENT:     Head: Normocephalic and  atraumatic.  Eyes:     General: No scleral icterus. Neck:     Vascular: No carotid bruit.  Cardiovascular:     Rate and Rhythm: Normal rate and regular rhythm.     Heart sounds: Normal heart sounds. No murmur heard.    No gallop.  Pulmonary:     Breath sounds: Normal breath sounds. No wheezing or rales.  Abdominal:     General: There is no distension.  Musculoskeletal:     Comments: Trace bilateral ankle and pretibial edema  Skin:    General: Skin is warm and dry.  Neurological:     General: No focal deficit present.     Mental Status: She is alert and oriented to person, place, and time.  Psychiatric:        Mood and Affect: Mood normal.        Behavior: Behavior normal.        Thought Content: Thought content normal.        Judgment: Judgment normal.    Assessment and Plan  1. Hypertension associated with diabetes (CMS/HHS-HCC)   2. Acute saddle pulmonary embolism without acute cor pulmonale (CMS/HHS-HCC)   3. LVH (left ventricular hypertrophy)     Ms. Sebree appears optimized from a cardiovascular standpoint.  She is not having any concerning symptoms of a cardiovascular nature, I think her edema is primarily due to chronic venous insufficiency.  Her blood pressure appears well-controlled, which is actually somewhat surprising given the amount of pain that she is in regarding her knee.  No further cardiac testing is indicated preoperatively.  She will follow the instructions regarding the Lovenox bridge to reduce the risk of recurrent clotting issues.  We will plan on seeing her back on an as-needed basis.  She will continue to follow closely with her PCP for blood pressure management. Requested Prescriptions    No prescriptions requested or ordered in this encounter   Non-Medicine Orders:No orders of the defined types were placed in this encounter.   Future Appointments     Date/Time Provider Department Center Visit Type   09/01/2023 9:00 AM (Arrive by 8:30 AM)  Carvin Alfonso Sar, PA [facility]REEK VASC NEW   09/13/2023 1:30 PM (Arrive by 1:15 PM) Courtney Dedra Bence, PA [facility] NEW PATIENT   09/20/2023 9:00 AM KC[facility]-ANTICOAG [facility] [place] C LAB   09/20/2023 1:30 PM PHARMACY/COAGULATION PROVIDER [facility] TELE[facility]ONE VISIT (CHARGEABLE)   10/10/2023 1:00 PM (Arrive by 12:45 PM) Marcille Jaynie Player, PA [facility] OFFICE VISIT   10/11/2023 9:00 AM (Arrive by 8:30 AM) Cammie Lacks, PA [facility] RETURN VISIT   10/17/2023 8:00 AM Jacquie Reyes Lenis, OD [facility] [place] [facility] INTERNAL EYE REFERRAL   11/15/2023 9:30 AM (Arrive by 9:15 AM) Taft Jayson Lenis, MD [facility] [place] ORTHO RETURN   12/01/2023 10:45 AM (Arrive by 10:30 AM) STUDIES/LAB-B[facility]   12/01/2023 11:45 AM (Arrive by 11:30 AM) Cyndee Standley Dunk, DO [facility] RETURN VISIT   12/22/2023 10:40 AM (Arrive by 10:25 AM) Tish Evalene Jayson, MD [facility] 1J Nephrology [facility] RETURN VISIT   01/31/2024 4:20 PM (Arrive by 3:50 PM) Rymer, Delon Dragon, MD [facility] [place] 2nd Floor [place] CARD RETURN   02/22/2024 2:30 PM (Arrive by 2:00 PM) Viviann Piggs, MD [facility] RETURN VISIT   04/24/2024 9:30 AM (Arrive by 9:00 AM) CLINIC 1-I, AUDIOLOGY [facility] AUDIO [facility]       04/24/2024 10:20 AM (Arrive by 9:50 AM) Lyle Morene Charles, PA [facility] RETURN VISIT            Answers submitted by the patient for this visit: Recent Medical Symptoms (Submitted on 08/29/2023) Night sweats: No Appetite Loss: No Weight loss: No Fever: No Daytime sleepiness: Yes visual change: No Hearing loss: No Sore throat:  No Hoarseness: No Cough: No Hemoptysis (Coughing up blood): No Shortness of breath: No Wheezing: No Chest pain: No Tachycardia (heart racing): No leg pain: Yes Nausea: No Vomiting: No Diarrhea: No Constipation: No Abdominal pain: No Bowel habits change: No Melena (Black tar-like stool): No Dysuria (Pain with urination): No Frequency (The need to urinate many times a day): Yes Hesitancy (Difficulty in beginning the flow of urine): No Joint pain: Yes Joint swelling: Yes Myalgias (Muscle pain): Yes Rash: No  Pigment changes/discoloration: Yes Memory loss: No Syncope (Fainting or passing out): No Extremity weakness: No Paresthesias (Pins and Needles): Yes Loss of balance: No

## 2023-08-31 ENCOUNTER — Emergency Department

## 2023-08-31 ENCOUNTER — Emergency Department
Admission: EM | Admit: 2023-08-31 | Discharge: 2023-08-31 | Disposition: A | Discharge status: Home / Self Care | Source: Ambulatory Visit | Attending: Emergency Medicine | Admitting: Emergency Medicine

## 2023-08-31 ENCOUNTER — Other Ambulatory Visit: Payer: Self-pay

## 2023-08-31 DIAGNOSIS — M7989 Other specified soft tissue disorders: Secondary | ICD-10-CM | POA: Diagnosis present

## 2023-08-31 DIAGNOSIS — R600 Localized edema: Secondary | ICD-10-CM

## 2023-08-31 DIAGNOSIS — Z7901 Long term (current) use of anticoagulants: Secondary | ICD-10-CM | POA: Diagnosis not present

## 2023-08-31 LAB — COMPREHENSIVE METABOLIC PANEL WITH GFR
ALT: 14 U/L (ref 0–44)
AST: 20 U/L (ref 15–41)
Albumin: 4 g/dL (ref 3.5–5.0)
Alkaline Phosphatase: 44 U/L (ref 38–126)
Anion gap: 6 (ref 5–15)
BUN: 28 mg/dL — ABNORMAL HIGH (ref 8–23)
CO2: 27 mmol/L (ref 22–32)
Calcium: 9.1 mg/dL (ref 8.9–10.3)
Chloride: 108 mmol/L (ref 98–111)
Creatinine, Ser: 1.53 mg/dL — ABNORMAL HIGH (ref 0.44–1.00)
GFR, Estimated: 34 mL/min — ABNORMAL LOW (ref >60)
Glucose, Bld: 129 mg/dL — ABNORMAL HIGH (ref 70–99)
Potassium: 4.4 mmol/L (ref 3.5–5.1)
Sodium: 141 mmol/L (ref 135–145)
Total Bilirubin: 1 mg/dL (ref 0.0–1.2)
Total Protein: 7.4 g/dL (ref 6.5–8.1)

## 2023-08-31 LAB — CBC WITH DIFFERENTIAL/PLATELET
Abs Immature Granulocytes: 0.01 K/uL (ref 0.00–0.07)
Basophils Absolute: 0 K/uL (ref 0.0–0.1)
Basophils Relative: 1 %
Eosinophils Absolute: 0.1 K/uL (ref 0.0–0.5)
Eosinophils Relative: 2 %
HCT: 42.8 % (ref 36.0–46.0)
Hemoglobin: 13.8 g/dL (ref 12.0–15.0)
Immature Granulocytes: 0 %
Lymphocytes Relative: 41 %
Lymphs Abs: 1.9 K/uL (ref 0.7–4.0)
MCH: 28.7 pg (ref 26.0–34.0)
MCHC: 32.2 g/dL (ref 30.0–36.0)
MCV: 89 fL (ref 80.0–100.0)
Monocytes Absolute: 0.5 K/uL (ref 0.1–1.0)
Monocytes Relative: 11 %
Neutro Abs: 2.1 K/uL (ref 1.7–7.7)
Neutrophils Relative %: 45 %
Platelets: 224 K/uL (ref 150–400)
RBC: 4.81 MIL/uL (ref 3.87–5.11)
RDW: 14.7 % (ref 11.5–15.5)
WBC: 4.6 K/uL (ref 4.0–10.5)
nRBC: 0 % (ref 0.0–0.2)

## 2023-08-31 NOTE — Group Note (Deleted)
 Date:  08/31/2023 Time:  2:22 PM  Group Topic/Focus:  Wellness Toolbox:   The focus of this group is to discuss various aspects of wellness, balancing those aspects and exploring ways to increase the ability to experience wellness.  Patients will create a wellness toolbox for use upon discharge.     Participation Level:  {BHH PARTICIPATION OZCZO:77735}  Participation Quality:  {BHH PARTICIPATION QUALITY:22265}  Affect:  {BHH AFFECT:22266}  Cognitive:  {BHH COGNITIVE:22267}  Insight: {BHH Insight2:20797}  Engagement in Group:  {BHH ENGAGEMENT IN HMNLE:77731}  Modes of Intervention:  {BHH MODES OF INTERVENTION:22269}  Additional Comments:  ***  Myra Curtistine BROCKS 08/31/2023, 2:22 PM

## 2023-08-31 NOTE — Discharge Instructions (Addendum)
 You were seen in the emergency department for swelling and skin changes to your legs.  You had an ultrasound that did not show any signs of a blood clot.  Your lab work was otherwise normal.  Concerned that this is from chronic venous stasis.  Get compression socks and elevate your legs anytime you are at rest.  Call your primary care physician today to schedule close follow-up appointment.  Return to the emergency department if you have any worsening symptoms.

## 2023-08-31 NOTE — ED Triage Notes (Signed)
 Patient sent over from [facility] for stinging and burning to bilateral ankles.

## 2023-08-31 NOTE — ED Provider Triage Note (Signed)
 Emergency Medicine Provider Triage Evaluation Note  Jazzmyne Rasnick , a 78 y.o. female  was evaluated in triage.  Pt complains of swelling to the right lower extremity/ankle. Sent by [facility] for further evaluation. Patient denies chest pain, shortness of breath. History of DVT. Patient believes she has eczema. Reports similar symptoms in left ankle since May.  Physical Exam  There were no vitals taken for this visit. Gen:   Awake, no distress   Resp:  Normal effort  MSK:   Moves extremities without difficulty  Other:  Non pitting edema right lower extremity/ankle with erythema. No open wounds. Skin temperature equal to left lower extremity  Medical Decision Making  Medically screening exam initiated at 11:58 AM.  Appropriate orders placed.  Kalista Laguardia was informed that the remainder of the evaluation will be completed by another provider, this initial triage assessment does not replace that evaluation, and the importance of remaining in the ED until their evaluation is complete.  Basic labs ordered. Possible cellulitis.   Herlinda Kirk NOVAK, FNP 08/31/23 1201

## 2023-08-31 NOTE — ED Provider Notes (Addendum)
 [facility] Provider Note    Event Date/Time   First MD Initiated Contact with Patient 08/31/23 1217     (approximate)   History   Ankle Pain   HPI  Virginia Hunt is a 78 y.o. female past medical history significant for prior DVT with history of pulmonary embolism on Coumadin, who presents to the emergency department with leg swelling.  Patient states that she has had a history of leg swelling in the past and previously when she had swelling to her left leg she was diagnosed with a DVT.  States that she has not missed any doses of Coumadin this month.  Complaining of some swelling to both of her legs with a tingling sensation and color change.  Feels like she has worsening darkening of her skin.  Worsening to the right leg.  Denies any chest pain or shortness of breath.  No trouble with ambulation.  No falls or trauma.  On chart review patient was evaluated by her cardiologist yesterday.  Felt that her swelling in her legs was secondary to chronic venous stasis.     Physical Exam   Triage Vital Signs: ED Triage Vitals [08/31/23 1158]  Encounter Vitals Group     BP (!) 199/86     Girls Systolic BP Percentile      Girls Diastolic BP Percentile      Boys Systolic BP Percentile      Boys Diastolic BP Percentile      Pulse Rate 63     Resp 18     Temp 98.7 F (37.1 C)     Temp Source Oral     SpO2 100 %     Weight      Height      Head Circumference      Peak Flow      Pain Score      Pain Loc      Pain Education      Exclude from Growth Chart     Most recent vital signs: Vitals:   08/31/23 1158  BP: (!) 199/86  Pulse: 63  Resp: 18  Temp: 98.7 F (37.1 C)  SpO2: 100%    Physical Exam Constitutional:      Appearance: She is well-developed.  HENT:     Head: Atraumatic.  Eyes:     Conjunctiva/sclera: Conjunctivae normal.  Cardiovascular:     Rate and Rhythm: Regular rhythm.  Pulmonary:     Effort: No respiratory distress.   Abdominal:     General: There is no distension.  Musculoskeletal:        General: Normal range of motion.     Cervical back: Normal range of motion.     Right lower leg: Edema present.     Left lower leg: Edema present.     Comments: Bilateral lower extremity edema that is worse in the right leg.  +2 DP pulses that are equal bilaterally.  No overlying erythema warmth or induration.  No open wounds.  No crepitance.   Skin:    General: Skin is warm.  Neurological:     Mental Status: She is alert. Mental status is at baseline.  Psychiatric:        Mood and Affect: Mood normal.     IMPRESSION / MDM / ASSESSMENT AND PLAN / ED COURSE  I reviewed the triage vital signs and the nursing notes.  Differential diagnosis including chronic venous stasis, heart failure, DVT.  Lower suspicion for cellulitis,  no fever, no chills, no overlying erythema warmth or induration.  No open wounds.  Clinic picture is not consistent with peripheral arterial disease and has good distal pulses.  No obvious rash, patient was concern for eczema but I do not see any any signs of eczema on exam  On arrival afebrile, hypertensive, 100% on room air    RADIOLOGY Ultrasound DVT to the right lower extremity -no signs of DVT  LABS (all labs ordered are listed, but only abnormal results are displayed) Labs interpreted as -    Labs Reviewed  COMPREHENSIVE METABOLIC PANEL WITH GFR - Abnormal; Notable for the following components:      Result Value   Glucose, Bld 129 (*)    BUN 28 (*)    Creatinine, Ser 1.53 (*)    GFR, Estimated 34 (*)    All other components within normal limits  CBC WITH DIFFERENTIAL/PLATELET     MDM  Lab work overall reassuring with no significant leukocytosis or anemia.  Creatinine appears to be at her baseline.  No significant electrolyte abnormality.  Ultrasound DVT to the right lower extremity with no signs of DVT  Patient was likely with chronic venous stasis.  She walks  approximately 10,000 steps on a regular basis.  States that she has been told to wear compression stockings in the past but she has difficulty putting them on her legs.  Discussed compression socks and elevation of her legs.  Discussed close follow-up with primary care physician.  Of low suspicion for heart failure given that she was evaluated by cardiology yesterday and she is not having any shortness of breath or chest pain.  Discussed close follow-up with cardiology and her primary care physician.  Discussed return to the emergency department if she had any ongoing or worsening symptoms.     PROCEDURES:  Critical Care performed: No  Procedures  Patient's presentation is most consistent with acute presentation with potential threat to life or bodily function.   MEDICATIONS ORDERED IN ED: Medications - No data to display  FINAL CLINICAL IMPRESSION(S) / ED DIAGNOSES   Final diagnoses:  Leg edema     Rx / DC Orders   ED Discharge Orders     None        Note:  This document was prepared using Dragon voice recognition software and may include unintentional dictation errors.   Suzanne Kirsch, MD 08/31/23 1323    Suzanne Kirsch, MD 08/31/23 1436

## 2023-10-11 ENCOUNTER — Other Ambulatory Visit: Payer: Self-pay | Admitting: Physician Assistant

## 2023-10-11 MED ORDER — OXYCODONE HCL 5 MG PO TABS: 5.0000 mg | ORAL_TABLET | Freq: Three times a day (TID) | ORAL | 0 refills | Status: DC | PRN

## 2023-10-11 MED ORDER — METHOCARBAMOL 500 MG PO TABS: 500.0000 mg | ORAL_TABLET | Freq: Two times a day (BID) | ORAL | 2 refills | Status: DC | PRN

## 2023-10-11 MED ORDER — DOXYCYCLINE HYCLATE 100 MG PO TABS: 100.0000 mg | ORAL_TABLET | Freq: Two times a day (BID) | ORAL | 0 refills | Status: DC

## 2023-10-11 MED ORDER — DOCUSATE SODIUM 100 MG PO CAPS: 100.0000 mg | ORAL_CAPSULE | Freq: Every day | ORAL | 2 refills | Status: AC | PRN

## 2023-10-11 MED ORDER — ONDANSETRON HCL 4 MG PO TABS: 4.0000 mg | ORAL_TABLET | Freq: Three times a day (TID) | ORAL | 0 refills | Status: AC | PRN

## 2023-10-20 NOTE — Progress Notes (Signed)
 Emailed [facility]G pre op instructions for patient to start on 10/24/2023.

## 2023-10-20 NOTE — Patient Instructions (Addendum)
 Pre-operative 4 CHG Bath Instructions  DYNA-Hex 4 Chlorhexidine  Gluconate 4% Solution Antiseptic 4 fl. oz  You can play a key role in reducing the risk of infection after surgery. Your skin needs to be as free of germs as possible. You can reduce the number of germs on your skin by washing with CHG (chlorhexidine  gluconate) soap before surgery. CHG is an antiseptic soap that kills germs and continues to kill germs even after washing.   DO NOT use if you have an allergy to chlorhexidine /CHG or antibacterial soaps. If your skin becomes reddened or irritated, stop using the CHG and notify one of our RNs at   Please shower with the CHG soap starting 4 days before surgery using the following schedule:     Please keep in mind the following:  DO NOT shave, including legs and underarms, starting the day of your first shower.   You may shave your face at any point before/day of surgery.  Place clean sheets on your bed the day you start using CHG soap. Use a clean washcloth (not used since being washed) for each shower. DO NOT sleep with pets once you start using the CHG.  CHG Shower Instructions:  If you choose to wash your hair and private area, wash first with your normal shampoo/soap.  After you use shampoo/soap, rinse your hair and body thoroughly to remove shampoo/soap residue.  Turn the water OFF and apply about 3 tablespoons (45 ml) of CHG soap to a CLEAN washcloth.  Apply CHG soap ONLY FROM YOUR NECK DOWN TO YOUR TOES (washing for 3-5 minutes)  DO NOT use CHG soap on face, private areas, open wounds, or sores.  Pay special attention to the area where your surgery is being performed.  If you are having back surgery, having someone wash your back for you may be helpful. Wait 2 minutes after CHG soap is applied, then you may rinse off the CHG soap.  Pat dry with a clean towel  Put on clean clothes/pajamas   If you choose to wear lotion, please use ONLY the CHG-compatible lotions on the  back of this paper.     Additional instructions for the day of surgery: DO NOT APPLY any lotions, deodorants, cologne, or perfumes.   Put on clean/comfortable clothes.  Brush your teeth.  Ask your nurse before applying any prescription medications to the skin.   CHG Compatible Lotions   Aveeno Moisturizing lotion  Cetaphil Moisturizing Cream  Cetaphil Moisturizing Lotion  Clairol Herbal Essence Moisturizing Lotion, Dry Skin  Clairol Herbal Essence Moisturizing Lotion, Extra Dry Skin  Clairol Herbal Essence Moisturizing Lotion, Normal Skin  Curel Age Defying Therapeutic Moisturizing Lotion with Alpha Hydroxy  Curel Extreme Care Body Lotion  Curel Soothing Hands Moisturizing Hand Lotion  Curel Therapeutic Moisturizing Cream, Fragrance-Free  Curel Therapeutic Moisturizing Lotion, Fragrance-Free  Curel Therapeutic Moisturizing Lotion, Original Formula  Eucerin Daily Replenishing Lotion  Eucerin Dry Skin Therapy Plus Alpha Hydroxy Crme  Eucerin Dry Skin Therapy Plus Alpha Hydroxy Lotion  Eucerin Original Crme  Eucerin Original Lotion  Eucerin Plus Crme Eucerin Plus Lotion  Eucerin TriLipid Replenishing Lotion  Keri Anti-Bacterial Hand Lotion  Keri Deep Conditioning Original Lotion Dry Skin Formula Softly Scented  Keri Deep Conditioning Original Lotion, Fragrance Free Sensitive Skin Formula  Keri Lotion Fast Absorbing Fragrance Free Sensitive Skin Formula  Keri Lotion Fast Absorbing Softly Scented Dry Skin Formula  Keri Original Lotion  Keri Skin Renewal Lotion Keri Silky Smooth Lotion  [place]  Smooth Sensitive Skin Lotion  Nivea Body Creamy Conditioning Oil  Nivea Body Extra Enriched Lotion  Nivea Body Original Lotion  Nivea Body Sheer Moisturizing Lotion Nivea Crme  Nivea Skin Firming Lotion  NutraDerm 30 Skin Lotion  NutraDerm Skin Lotion  NutraDerm Therapeutic Skin Cream  NutraDerm Therapeutic Skin Lotion  ProShield Protective Hand Cream  Provon moisturizing  lotion

## 2023-10-24 NOTE — Progress Notes (Signed)
 COVID Vaccine received:  []  No [x]  Yes Date of any COVID positive Test in last 90 days: no PCP - Verneita Millin PA-C Cardiologist - At [facility]  Chest x-ray -  EKG -   Stress Test -  ECHO -  Cardiac Cath -   Cardiac clearance Ozell Barrier 08/30/23   Bowel Prep - [x]  No  []   Yes ______  Pacemaker / ICD device [x]  No []  Yes   Spinal Cord Stimulator:[x]  No []  Yes       History of Sleep Apnea? []  No [x]  Yes   CPAP used?- [x]  No []  Yes    Does the patient monitor blood sugar?          []  No [x]  Yes  []  N/A  Patient has: []  NO Hx DM   []  Pre-DM                 []  DM1  [x]   DM2 Does patient have a [employer] or Dexacom? [x]  No []  Yes   Fasting Blood Sugar Ranges- 130-140 Checks Blood Sugar __1___ time in 6months  GLP1 agonist / usual dose - no GLP1 instructions:  SGLT-2 inhibitors / usual dose - no SGLT-2 instructions:   Blood Thinner / Instructions:Is on Warfarin,last dose 10/23/23. Is on Lovenox now will stop on 10/27/23 Aspirin Instructions:no  Comments:   Activity level: Patient is able to climb a flight of stairs without difficulty; [x]  No CP  [x]  No SOB,  Patient can perform ADLs without assistance.   Anesthesia review: HTN, DM, Hx. DVT, On Warfarin  Patient denies shortness of breath, fever, cough and chest pain at PAT appointment.  Patient verbalized understanding and agreement to the Pre-Surgical Instructions that were given to them at this PAT appointment. Patient was also educated of the need to review these PAT instructions again prior to his/her surgery.I reviewed the appropriate phone numbers to call if they have any and questions or concerns.

## 2023-10-24 NOTE — Patient Instructions (Signed)
 SURGICAL WAITING ROOM VISITATION  Patients having surgery or a procedure may have no more than 2 support people in the waiting area - these visitors may rotate.    Children under the age of 27 must have an adult with them who is not the patient.  Visitors with respiratory illnesses are discouraged from visiting and should remain at home.  If the patient needs to stay at the hospital during part of their recovery, the visitor guidelines for inpatient rooms apply. Pre-op nurse will coordinate an appropriate time for 1 support person to accompany patient in pre-op.  This support person may not rotate.    Please refer to the [facility] website for the visitor guidelines for Inpatients (after your surgery is over and you are in a regular room).       Your procedure is scheduled on: 10/28/23   Report to [facility] Main Entrance    Report to admitting at 5:15 AM   Call this number if you have problems the morning of surgery [phone]   Do not eat food :After Midnight.   After Midnight you may have the following liquids until 4:30 AM DAY OF SURGERY  Water Non-Citrus Juices (without pulp, NO RED-Apple, White grape, White cranberry) Black Coffee (NO MILK/CREAM OR CREAMERS, sugar ok)  Clear Tea (NO MILK/CREAM OR CREAMERS, sugar ok) regular and decaf                             Plain Jell-O (NO RED)                                           Fruit ices (not with fruit pulp, NO RED)                                     Popsicles (NO RED)                                                               Sports drinks like Gatorade (NO RED)                  The day of surgery:  Drink ONE (1) Pre-Surgery G2 at 4:30 AM the morning of surgery. Drink in one sitting. Do not sip.  This drink was given to you during your hospital  pre-op appointment visit. Nothing else to drink after completing the  Pre-Surgery  G2.     Oral Hygiene is also important to reduce your risk of infection.                                     Remember - BRUSH YOUR TEETH THE MORNING OF SURGERY WITH YOUR REGULAR TOOTHPASTE  DENTURES WILL BE REMOVED PRIOR TO SURGERY PLEASE DO NOT APPLY Poly grip OR ADHESIVES!!!   Stop all vitamins and herbal supplements 7 days before surgery.   Take these medicines the morning of surgery with A SIP OF WATER: Nasal spray, loratadine, oxycodone , pregabalin.  Do not take Losartan the morning of surgery.   DO NOT TAKE ANY ORAL DIABETIC MEDICATIONS DAY OF YOUR SURGERY             You may not have any metal on your body including hair pins, jewelry, and body piercing             Do not wear make-up, lotions, powders, perfumes/cologne, or deodorant  Do not wear nail polish including gel and S&S, artificial/acrylic nails, or any other type of covering on natural nails including finger and toenails. If you have artificial nails, gel coating, etc. that needs to be removed by a nail salon please have this removed prior to surgery or surgery may need to be canceled/ delayed if the surgeon/ anesthesia feels like they are unable to be safely monitored.   Do not shave  48 hours prior to surgery.    Do not bring valuables to the hospital. [facility] IS NOT             RESPONSIBLE   FOR VALUABLES.   Contacts, glasses, dentures or bridgework may not be worn into surgery.   Bring small overnight bag day of surgery.   DO NOT BRING YOUR HOME MEDICATIONS TO THE HOSPITAL. PHARMACY WILL DISPENSE MEDICATIONS LISTED ON YOUR MEDICATION LIST TO YOU DURING YOUR ADMISSION IN THE HOSPITAL!    Patients discharged on the day of surgery will not be allowed to drive home.  Someone NEEDS to stay with you for the first 24 hours after anesthesia.   Special Instructions: Bring a copy of your healthcare power of attorney and living will documents the day of surgery if you haven't scanned them before.              Please read over the following fact sheets you were given: IF YOU  HAVE QUESTIONS ABOUT YOUR PRE-OP INSTRUCTIONS PLEASE CALL [phone] Verneita   If you received a COVID test during your pre-op visit  it is requested that you wear a mask when out in public, stay away from anyone that may not be feeling well and notify your surgeon if you develop symptoms. If you test positive for Covid or have been in contact with anyone that has tested positive in the last 10 days please notify you surgeon.      Pre-operative 4 CHG Bath Instructions  DYNA-Hex 4 Chlorhexidine  Gluconate 4% Solution Antiseptic 4 fl. oz   You can play a key role in reducing the risk of infection after surgery. Your skin needs to be as free of germs as possible. You can reduce the number of germs on your skin by washing with CHG (chlorhexidine  gluconate) soap before surgery. CHG is an antiseptic soap that kills germs and continues to kill germs even after washing.   DO NOT use if you have an allergy to chlorhexidine /CHG or antibacterial soaps. If your skin becomes reddened or irritated, stop using the CHG and notify one of our RNs at   Please shower with the CHG soap starting 4 days before surgery using the following schedule:     Please keep in mind the following:  DO NOT shave, including legs and underarms, starting the day of your first shower.   You may shave your face at any point before/day of surgery.  Place clean sheets on your bed the day you start using CHG soap. Use a clean washcloth (not used since being washed) for each shower. DO NOT sleep with pets once you start  using the CHG.  CHG Shower Instructions:  If you choose to wash your hair and private area, wash first with your normal shampoo/soap.  After you use shampoo/soap, rinse your hair and body thoroughly to remove shampoo/soap residue.  Turn the water OFF and apply about 3 tablespoons (45 ml) of CHG soap to a CLEAN washcloth.  Apply CHG soap ONLY FROM YOUR NECK DOWN TO YOUR TOES (washing for 3-5 minutes)  DO NOT use CHG  soap on face, private areas, open wounds, or sores.  Pay special attention to the area where your surgery is being performed.  If you are having back surgery, having someone wash your back for you may be helpful. Wait 2 minutes after CHG soap is applied, then you may rinse off the CHG soap.  Pat dry with a clean towel  Put on clean clothes/pajamas   If you choose to wear lotion, please use ONLY the CHG-compatible lotions on the back of this paper.     Additional instructions for the day of surgery: DO NOT APPLY any lotions, deodorants, cologne, or perfumes.   Put on clean/comfortable clothes.  Brush your teeth.  Ask your nurse before applying any prescription medications to the skin.   CHG Compatible Lotions   Aveeno Moisturizing lotion  Cetaphil Moisturizing Cream  Cetaphil Moisturizing Lotion  Clairol Herbal Essence Moisturizing Lotion, Dry Skin  Clairol Herbal Essence Moisturizing Lotion, Extra Dry Skin  Clairol Herbal Essence Moisturizing Lotion, Normal Skin  Curel Age Defying Therapeutic Moisturizing Lotion with Alpha Hydroxy  Curel Extreme Care Body Lotion  Curel Soothing Hands Moisturizing Hand Lotion  Curel Therapeutic Moisturizing Cream, Fragrance-Free  Curel Therapeutic Moisturizing Lotion, Fragrance-Free  Curel Therapeutic Moisturizing Lotion, Original Formula  Eucerin Daily Replenishing Lotion  Eucerin Dry Skin Therapy Plus Alpha Hydroxy Crme  Eucerin Dry Skin Therapy Plus Alpha Hydroxy Lotion  Eucerin Original Crme  Eucerin Original Lotion  Eucerin Plus Crme Eucerin Plus Lotion  Eucerin TriLipid Replenishing Lotion  Keri Anti-Bacterial Hand Lotion  Keri Deep Conditioning Original Lotion Dry Skin Formula Softly Scented  Keri Deep Conditioning Original Lotion, Fragrance Free Sensitive Skin Formula  Keri Lotion Fast Absorbing Fragrance Free Sensitive Skin Formula  Keri Lotion Fast Absorbing Softly Scented Dry Skin Formula  Keri Original Lotion  Keri Skin Renewal  Lotion Keri Silky Smooth Lotion  Keri Silky Smooth Sensitive Skin Lotion  Nivea Body Creamy Conditioning Oil  Nivea Body Extra Enriched Lotion  Nivea Body Original Lotion  Nivea Body Sheer Moisturizing Lotion Nivea Crme  Nivea Skin Firming Lotion  NutraDerm 30 Skin Lotion  NutraDerm Skin Lotion  NutraDerm Therapeutic Skin Cream  NutraDerm Therapeutic Skin Lotion  ProShield Protective Hand Cream  Incentive Spirometer  An incentive spirometer is a tool that can help keep your lungs clear and active. This tool measures how well you are filling your lungs with each breath. Taking long deep breaths may help reverse or decrease the chance of developing breathing (pulmonary) problems (especially infection) following: A long period of time when you are unable to move or be active. BEFORE THE PROCEDURE  If the spirometer includes an indicator to show your best effort, your nurse or respiratory therapist will set it to a desired goal. If possible, sit up straight or lean slightly forward. Try not to slouch. Hold the incentive spirometer in an upright position. INSTRUCTIONS FOR USE  Sit on the edge of your bed if possible, or sit up as far as you can in bed or on a chair. Hold  the incentive spirometer in an upright position. Breathe out normally. Place the mouthpiece in your mouth and seal your lips tightly around it. Breathe in slowly and as deeply as possible, raising the piston or the ball toward the top of the column. Hold your breath for 3-5 seconds or for as long as possible. Allow the piston or ball to fall to the bottom of the column. Remove the mouthpiece from your mouth and breathe out normally. Rest for a few seconds and repeat Steps 1 through 7 at least 10 times every 1-2 hours when you are awake. Take your time and take a few normal breaths between deep breaths. The spirometer may include an indicator to show your best effort. Use the indicator as a goal to work toward during each  repetition. After each set of 10 deep breaths, practice coughing to be sure your lungs are clear. If you have an incision (the cut made at the time of surgery), support your incision when coughing by placing a pillow or rolled up towels firmly against it. Once you are able to get out of bed, walk around indoors and cough well. You may stop using the incentive spirometer when instructed by your caregiver.  RISKS AND COMPLICATIONS Take your time so you do not get dizzy or light-headed. If you are in pain, you may need to take or ask for pain medication before doing incentive spirometry. It is harder to take a deep breath if you are having pain. AFTER USE Rest and breathe slowly and easily. It can be helpful to keep track of a log of your progress. Your caregiver can provide you with a simple table to help with this. If you are using the spirometer at home, follow these instructions: SEEK MEDICAL CARE IF:  You are having difficultly using the spirometer. You have trouble using the spirometer as often as instructed. Your pain medication is not giving enough relief while using the spirometer. You develop fever of 100.5 F (38.1 C) or higher. SEEK IMMEDIATE MEDICAL CARE IF:  You cough up bloody sputum that had not been present before. You develop fever of 102 F (38.9 C) or greater. You develop worsening pain at or near the incision site. MAKE SURE YOU:  Understand these instructions. Will watch your condition. Will get help right away if you are not doing well or get worse.  SABRAHow to Manage Your Diabetes Before and After Surgery  Why is it important to control my blood sugar before and after surgery? Improving blood sugar levels before and after surgery helps healing and can limit problems. A way of improving blood sugar control is eating a healthy diet by:  Eating less sugar and carbohydrates  Increasing activity/exercise  Talking with your doctor about reaching your blood sugar  goals High blood sugars (greater than 180 mg/dL) can raise your risk of infections and slow your recovery, so you will need to focus on controlling your diabetes during the weeks before surgery. Make sure that the doctor who takes care of your diabetes knows about your planned surgery including the date and location.  How do I manage my blood sugar before surgery? Check your blood sugar at least 4 times a day, starting 2 days before surgery, to make sure that the level is not too high or low. Check your blood sugar the morning of your surgery when you wake up and every 2 hours until you get to the Short Stay unit. If your blood sugar is less than  70 mg/dL, you will need to treat for low blood sugar: Do not take insulin. Treat a low blood sugar (less than 70 mg/dL) with  cup of clear juice (cranberry or apple), 4 glucose tablets, OR glucose gel. Recheck blood sugar in 15 minutes after treatment (to make sure it is greater than 70 mg/dL). If your blood sugar is not greater than 70 mg/dL on recheck, call [phone] for further instructions. Report your blood sugar to the short stay nurse when you get to Short Stay.  If you are admitted to the hospital after surgery: Your blood sugar will be checked by the staff and you will probably be given insulin after surgery (instead of oral diabetes medicines) to make sure you have good blood sugar levels. The goal for blood sugar control after surgery is 80-180 mg/dL.   WHAT DO I DO ABOUT MY DIABETES MEDICATION?  Do not take oral diabetes medicines (pills) the morning of surgery.  THE NIGHT BEFORE SURGERY, take     units of       insulin.       THE MORNING OF SURGERY, take   units of         insulin.  DO NOT TAKE THE FOLLOWING 7 DAYS PRIOR TO SURGERY: Ozempic, Wegovy, Rybelsus (Semaglutide), Byetta (exenatide), Bydureon (exenatide ER), Victoza, Saxenda (liraglutide), or Trulicity (dulaglutide) Mounjaro (Tirzepatide) Adlyxin (Lixisenatide),  Polyethylene Glycol Loxenatide.  If your CBG is greater than 220 mg/dL, you may take  of your sliding scale  (correction) dose of insulin.    For patients with insulin pumps: Contact your diabetes doctor for specific instructions before surgery. Decrease basal rates by 20% at midnight the night before your surgery. Note that if your surgery is planned to be longer than 2 hours, your insulin pump will be removed and intravenous (IV) insulin will be started and managed by the nurses and the anesthesiologist. You will be able to restart your insulin pump once you are awake and able to manage it.  Make sure to bring insulin pump supplies to the hospital with you in case the  site needs to be changed.  Patient Signature:  Date:   Nurse Signature:  Date:   Reviewed and Endorsed by [facility] Patient Education Committee, August 2015

## 2023-10-25 ENCOUNTER — Other Ambulatory Visit: Payer: Self-pay

## 2023-10-25 ENCOUNTER — Encounter (HOSPITAL_COMMUNITY)
Admission: RE | Admit: 2023-10-25 | Discharge: 2023-10-25 | Disposition: A | Discharge status: Home / Self Care | Source: Ambulatory Visit | Attending: Orthopaedic Surgery | Admitting: Orthopaedic Surgery

## 2023-10-25 ENCOUNTER — Encounter (HOSPITAL_COMMUNITY): Payer: Self-pay

## 2023-10-25 VITALS — BP 180/85 | HR 65 | Temp 98.2°F | Resp 16 | Ht 66.5 in | Wt 234.0 lb

## 2023-10-25 DIAGNOSIS — Z86711 Personal history of pulmonary embolism: Secondary | ICD-10-CM | POA: Diagnosis not present

## 2023-10-25 DIAGNOSIS — Z86718 Personal history of other venous thrombosis and embolism: Secondary | ICD-10-CM | POA: Diagnosis not present

## 2023-10-25 DIAGNOSIS — M1712 Unilateral primary osteoarthritis, left knee: Secondary | ICD-10-CM | POA: Diagnosis not present

## 2023-10-25 DIAGNOSIS — Z01818 Encounter for other preprocedural examination: Secondary | ICD-10-CM | POA: Diagnosis present

## 2023-10-25 DIAGNOSIS — M797 Fibromyalgia: Secondary | ICD-10-CM | POA: Diagnosis not present

## 2023-10-25 DIAGNOSIS — R001 Bradycardia, unspecified: Secondary | ICD-10-CM | POA: Insufficient documentation

## 2023-10-25 DIAGNOSIS — E119 Type 2 diabetes mellitus without complications: Secondary | ICD-10-CM | POA: Diagnosis not present

## 2023-10-25 DIAGNOSIS — J45909 Unspecified asthma, uncomplicated: Secondary | ICD-10-CM | POA: Insufficient documentation

## 2023-10-25 DIAGNOSIS — Z87891 Personal history of nicotine dependence: Secondary | ICD-10-CM | POA: Diagnosis not present

## 2023-10-25 DIAGNOSIS — I10 Essential (primary) hypertension: Secondary | ICD-10-CM | POA: Diagnosis not present

## 2023-10-25 HISTORY — DX: Dyspnea, unspecified: R06.00

## 2023-10-25 HISTORY — DX: Malignant (primary) neoplasm, unspecified: C80.1

## 2023-10-25 HISTORY — DX: Unspecified asthma, uncomplicated: J45.909

## 2023-10-25 HISTORY — DX: Other complications of anesthesia, initial encounter: T88.59XA

## 2023-10-25 LAB — SURGICAL PCR SCREEN
MRSA, PCR: NEGATIVE
Staphylococcus aureus: NEGATIVE

## 2023-10-25 LAB — HEMOGLOBIN A1C
Hgb A1c MFr Bld: 6.2 % — ABNORMAL HIGH (ref 4.8–5.6)
Mean Plasma Glucose: 131.24 mg/dL

## 2023-10-25 LAB — GLUCOSE, CAPILLARY: Glucose-Capillary: 98 mg/dL (ref 70–99)

## 2023-10-26 ENCOUNTER — Encounter (HOSPITAL_COMMUNITY): Payer: Self-pay

## 2023-10-26 NOTE — Progress Notes (Signed)
 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  Patient seen by Cardiology at [facility] on 07/26/23 for pre op eval. She was cleared for surgery:  Pre-operative evaluation Patient eager to undergo elective left knee replacement. She is active (works in [employer], [employer]) and I estimate that she regularly performs at least 4 METs without cardiopulmonary symptoms such as chest pain or shortness of breath. RCRI score = 0, conferring a low risk of perioperative major adverse cardiac events, though admittedly this may be an underestimate. Patient's recent acute DVT/PE within the last 3 months (discussed below) likely poses her most significant risk factor for perioperative events.  - Most recent TTE demonstrated normal biventricular function.  - Good functional capacity. No indication for further ischemic evaluation. - We discussed the risk of recurrent DVT/PE in the perioperative setting. Would defer to Hematology regarding duration of therapeutic anticoagulation that would be recommended before potentially interrupting for orthopedic surgery.  Patient seen by Vascular due to hx of extensive DVT and PE. 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 RLE US  on 08/31/23 showed DVT had resolved. Vascular recommended pneumatic compression therapy due to chronic BLE edema.   Patient seen by Hematology at [facility]. Seen on 08/19/23. Recommendations are indefinite anticoagulation and she in on Coumadin for financial reasons. Cleared for surgery: In regards to a  left total knee arthroplasty, from a hematology standpoint, OK to proceed once pt has been anticoagulated for three months (so after 08/24/23). Patient asked to notify us  once date of surgery is scheduled so that we can provide calendar for enoxaparin bridge.  LD Warfarin: 10/23/23 and will be completing Lovenox bridge (LD Lovenox 10/9 in AM).  VS: BP (!) 180/85 Comment: Pt. reports she did not take her morning blood pressure medicine yet but she said she will take it when she gets home.  Pulse 65   Temp 36.8 C (Oral)   Resp 16   Ht 5' 6.5 (1.689 m)   Wt 106.1 kg   SpO2 100%   BMI 37.20 kg/m   PROVIDERS: Jacquelyn Jaynie Player, PA-C   LABS: Labs reviewed: Acceptable for surgery. INR for DOS (all labs ordered are listed, but only abnormal results are displayed)  Labs Reviewed  HEMOGLOBIN A1C - Abnormal; Notable for the following components:      Result Value   Hgb A1c MFr Bld 6.2 (*)    All other components within normal limits  SURGICAL PCR SCREEN  GLUCOSE, CAPILLARY     CTA Chest 05/24/23 ([facility]):  Impression:  Exam positive for extensive pulmonary embolism. Bilateral lobar pulmonary embolism, with embolism involving the left upper, right upper, right middle, and right lower lobar arteries , with small saddle pulmonary embolus component in the main pulmonary arteries. There is evidence for possible right heart strain, RV LV ratio of 1.4. Recommend echocardiography. The main pulmonary artery is not enlarged.  EKG 10/25/23:  Sinus bradycardia, rate 50 Moderate voltage criteria for LVH, may be normal variant ( R in aVL , Cornell product )  Borderline ECG  Echo with bubble study 05/25/23 ([facility]):  CONCLUSION ------------------------------------------------------------------------------- NORMAL LEFT VENTRICULAR SYSTOLIC FUNCTION WITH MODERATE LVH ESTIMATED EF: >55%, CALC EF(2D): 73% NORMAL LA PRESSURES WITH NORMAL DIASTOLIC FUNCTION NORMAL RIGHT VENTRICULAR SYSTOLIC  FUNCTION VALVULAR REGURGITATION: No AR, No MR, TRIVIAL PR, TRIVIAL TR NO VALVULAR STENOSIS -------------------------------  Past Medical History:  Diagnosis Date   Arthritis    Asthma    Cancer (HCC)    Carpal tunnel syndrome    Complication of anesthesia    Diabetes mellitus without complication (HCC)    pre diabetes   Diverticulitis    Dyspnea    Fibromyalgia    Hypertension     Past Surgical History:  Procedure Laterality Date   ABDOMINAL HYSTERECTOMY     CHOLECYSTECTOMY     KNEE SURGERY     TONSILLECTOMY      MEDICATIONS:  docusate sodium  (COLACE) 100 MG capsule   doxycycline  (VIBRA -TABS) 100 MG tablet   cyanocobalamin (VITAMIN B12) 500 MCG tablet   fluticasone (FLONASE) 50 MCG/ACT nasal spray   loratadine (CLARITIN) 10 MG tablet   losartan (COZAAR) 100 MG tablet   methocarbamol  (ROBAXIN ) 500 MG tablet   methocarbamol  (ROBAXIN ) 500 MG tablet   ondansetron  (ZOFRAN ) 4 MG tablet   oxyCODONE  (ROXICODONE ) 5 MG immediate release tablet   predniSONE  (DELTASONE ) 20 MG tablet   pregabalin (LYRICA) 25 MG capsule   warfarin (COUMADIN) 7.5 MG tablet   No current facility-administered medications for this encounter.   Virginia Hunt/[facility] Surgical Short Stay/Anesthesiology [facility] Phone [phone] 10/27/2023 11:40 AM

## 2023-10-27 MED ORDER — TRANEXAMIC ACID 1000 MG/10ML IV SOLN
2000.0000 mg | INTRAVENOUS | Status: DC
  Filled 2023-10-27: qty 20

## 2023-10-28 ENCOUNTER — Encounter (HOSPITAL_COMMUNITY): Payer: Self-pay | Admitting: Orthopaedic Surgery

## 2023-10-28 ENCOUNTER — Encounter (HOSPITAL_COMMUNITY)
Admission: RE | Disposition: A | Discharge status: Home / Self Care | Payer: Self-pay | Source: Home / Self Care | Attending: Orthopaedic Surgery

## 2023-10-28 ENCOUNTER — Encounter (HOSPITAL_COMMUNITY): Payer: Self-pay | Admitting: Medical

## 2023-10-28 ENCOUNTER — Other Ambulatory Visit: Payer: Self-pay

## 2023-10-28 ENCOUNTER — Observation Stay (HOSPITAL_COMMUNITY)
Admission: RE | Admit: 2023-10-28 | Discharge: 2023-10-30 | Disposition: A | Discharge status: Home / Self Care | Attending: Orthopaedic Surgery | Admitting: Orthopaedic Surgery

## 2023-10-28 ENCOUNTER — Other Ambulatory Visit: Payer: Self-pay | Admitting: Physician Assistant

## 2023-10-28 ENCOUNTER — Ambulatory Visit (HOSPITAL_COMMUNITY): Payer: Self-pay | Admitting: Medical

## 2023-10-28 ENCOUNTER — Observation Stay (HOSPITAL_COMMUNITY)

## 2023-10-28 DIAGNOSIS — Z23 Encounter for immunization: Secondary | ICD-10-CM | POA: Insufficient documentation

## 2023-10-28 DIAGNOSIS — Z87891 Personal history of nicotine dependence: Secondary | ICD-10-CM | POA: Diagnosis not present

## 2023-10-28 DIAGNOSIS — I10 Essential (primary) hypertension: Secondary | ICD-10-CM | POA: Diagnosis not present

## 2023-10-28 DIAGNOSIS — Z859 Personal history of malignant neoplasm, unspecified: Secondary | ICD-10-CM | POA: Diagnosis not present

## 2023-10-28 DIAGNOSIS — J45909 Unspecified asthma, uncomplicated: Secondary | ICD-10-CM | POA: Diagnosis not present

## 2023-10-28 DIAGNOSIS — Z86718 Personal history of other venous thrombosis and embolism: Secondary | ICD-10-CM | POA: Insufficient documentation

## 2023-10-28 DIAGNOSIS — E119 Type 2 diabetes mellitus without complications: Secondary | ICD-10-CM | POA: Insufficient documentation

## 2023-10-28 DIAGNOSIS — M25562 Pain in left knee: Secondary | ICD-10-CM | POA: Diagnosis present

## 2023-10-28 DIAGNOSIS — M1712 Unilateral primary osteoarthritis, left knee: Principal | ICD-10-CM

## 2023-10-28 DIAGNOSIS — Z86711 Personal history of pulmonary embolism: Secondary | ICD-10-CM

## 2023-10-28 DIAGNOSIS — Z79899 Other long term (current) drug therapy: Secondary | ICD-10-CM | POA: Insufficient documentation

## 2023-10-28 DIAGNOSIS — Z96652 Presence of left artificial knee joint: Secondary | ICD-10-CM

## 2023-10-28 HISTORY — PX: TOTAL KNEE ARTHROPLASTY: SHX125

## 2023-10-28 LAB — PROTIME-INR
INR: 1.1 (ref 0.8–1.2)
Prothrombin Time: 14.8 s (ref 11.4–15.2)

## 2023-10-28 LAB — GLUCOSE, CAPILLARY
Glucose-Capillary: 110 mg/dL — ABNORMAL HIGH (ref 70–99)
Glucose-Capillary: 133 mg/dL — ABNORMAL HIGH (ref 70–99)
Glucose-Capillary: 138 mg/dL — ABNORMAL HIGH (ref 70–99)
Glucose-Capillary: 188 mg/dL — ABNORMAL HIGH (ref 70–99)

## 2023-10-28 SURGERY — ARTHROPLASTY, KNEE, TOTAL
Anesthesia: Regional | Site: Knee | Laterality: Left

## 2023-10-28 MED ORDER — ACETAMINOPHEN 325 MG PO TABS
325.0000 mg | ORAL_TABLET | Freq: Four times a day (QID) | ORAL | Status: DC | PRN
  Administered 2023-10-29: 325 mg via ORAL
  Filled 2023-10-28: qty 1

## 2023-10-28 MED ORDER — METHOCARBAMOL 1000 MG/10ML IJ SOLN: 500.0000 mg | Freq: Four times a day (QID) | INTRAMUSCULAR | Status: DC | PRN

## 2023-10-28 MED ORDER — DEXAMETHASONE SOD PHOSPHATE PF 10 MG/ML IJ SOLN
INTRAMUSCULAR | Status: DC | PRN
  Administered 2023-10-28: 4 mg via INTRAVENOUS

## 2023-10-28 MED ORDER — FENTANYL CITRATE (PF) 50 MCG/ML IJ SOSY
PREFILLED_SYRINGE | INTRAMUSCULAR | Status: AC
  Filled 2023-10-28: qty 2

## 2023-10-28 MED ORDER — PROPOFOL 10 MG/ML IV BOLUS
INTRAVENOUS | Status: AC
  Filled 2023-10-28: qty 20

## 2023-10-28 MED ORDER — LOSARTAN POTASSIUM 50 MG PO TABS
100.0000 mg | ORAL_TABLET | Freq: Every day | ORAL | Status: DC
  Administered 2023-10-28 – 2023-10-30 (×3): 100 mg via ORAL
  Filled 2023-10-28 (×4): qty 2

## 2023-10-28 MED ORDER — MIDAZOLAM HCL 5 MG/5ML IJ SOLN
INTRAMUSCULAR | Status: DC | PRN
  Administered 2023-10-28: 1 mg via INTRAVENOUS

## 2023-10-28 MED ORDER — CHLORHEXIDINE GLUCONATE 0.12 % MT SOLN
15.0000 mL | Freq: Once | OROMUCOSAL | Status: AC
  Administered 2023-10-28: 15 mL via OROMUCOSAL

## 2023-10-28 MED ORDER — SUGAMMADEX SODIUM 200 MG/2ML IV SOLN
INTRAVENOUS | Status: DC | PRN
  Administered 2023-10-28: 200 mg via INTRAVENOUS

## 2023-10-28 MED ORDER — METHOCARBAMOL 500 MG PO TABS
500.0000 mg | ORAL_TABLET | Freq: Four times a day (QID) | ORAL | Status: DC | PRN
  Administered 2023-10-28 – 2023-10-30 (×6): 500 mg via ORAL
  Filled 2023-10-28 (×6): qty 1

## 2023-10-28 MED ORDER — LACTATED RINGERS IV SOLN: INTRAVENOUS | Status: DC

## 2023-10-28 MED ORDER — FENTANYL CITRATE (PF) 100 MCG/2ML IJ SOLN
INTRAMUSCULAR | Status: AC
  Filled 2023-10-28: qty 2

## 2023-10-28 MED ORDER — MENTHOL 3 MG MT LOZG: 1.0000 | LOZENGE | OROMUCOSAL | Status: DC | PRN

## 2023-10-28 MED ORDER — FENTANYL CITRATE (PF) 50 MCG/ML IJ SOSY
PREFILLED_SYRINGE | INTRAMUSCULAR | Status: AC
  Filled 2023-10-28: qty 1

## 2023-10-28 MED ORDER — ACETAMINOPHEN 500 MG PO TABS
1000.0000 mg | ORAL_TABLET | Freq: Four times a day (QID) | ORAL | Status: AC
  Administered 2023-10-28 (×2): 500 mg via ORAL
  Administered 2023-10-29: 1000 mg via ORAL
  Filled 2023-10-28 (×4): qty 2

## 2023-10-28 MED ORDER — OXYCODONE HCL 5 MG PO TABS: 5.0000 mg | ORAL_TABLET | Freq: Four times a day (QID) | ORAL | Status: DC | PRN

## 2023-10-28 MED ORDER — OXYCODONE HCL 5 MG PO TABS: 5.0000 mg | ORAL_TABLET | Freq: Once | ORAL | Status: DC | PRN

## 2023-10-28 MED ORDER — OXYCODONE HCL 5 MG PO TABS
10.0000 mg | ORAL_TABLET | Freq: Four times a day (QID) | ORAL | Status: DC | PRN
  Administered 2023-10-28 – 2023-10-30 (×6): 10 mg via ORAL
  Filled 2023-10-28 (×6): qty 2

## 2023-10-28 MED ORDER — PROPOFOL 1000 MG/100ML IV EMUL
INTRAVENOUS | Status: AC
  Filled 2023-10-28: qty 100

## 2023-10-28 MED ORDER — SODIUM CHLORIDE 0.9 % IR SOLN
Status: DC | PRN
Start: 2023-10-28 — End: 2023-10-28
  Administered 2023-10-28: 1000 mL

## 2023-10-28 MED ORDER — CEFAZOLIN SODIUM-DEXTROSE 2-4 GM/100ML-% IV SOLN
2.0000 g | Freq: Four times a day (QID) | INTRAVENOUS | Status: AC
  Administered 2023-10-28 (×2): 2 g via INTRAVENOUS
  Filled 2023-10-28 (×2): qty 100

## 2023-10-28 MED ORDER — ONDANSETRON HCL 4 MG/2ML IJ SOLN
INTRAMUSCULAR | Status: DC | PRN
  Administered 2023-10-28: 4 mg via INTRAVENOUS

## 2023-10-28 MED ORDER — VANCOMYCIN HCL 1 G IV SOLR
INTRAVENOUS | Status: DC | PRN
Start: 2023-10-28 — End: 2023-10-28
  Administered 2023-10-28: 1000 mg via TOPICAL

## 2023-10-28 MED ORDER — OXYCODONE HCL 5 MG/5ML PO SOLN: 5.0000 mg | Freq: Once | ORAL | Status: DC | PRN

## 2023-10-28 MED ORDER — DEXAMETHASONE SOD PHOSPHATE PF 10 MG/ML IJ SOLN
10.0000 mg | Freq: Once | INTRAMUSCULAR | Status: AC
  Administered 2023-10-29: 10 mg via INTRAVENOUS

## 2023-10-28 MED ORDER — FENTANYL CITRATE (PF) 100 MCG/2ML IJ SOLN
INTRAMUSCULAR | Status: DC | PRN
  Administered 2023-10-28 (×4): 50 ug via INTRAVENOUS

## 2023-10-28 MED ORDER — METOCLOPRAMIDE HCL 5 MG/ML IJ SOLN: 5.0000 mg | Freq: Three times a day (TID) | INTRAMUSCULAR | Status: DC | PRN

## 2023-10-28 MED ORDER — HYDROMORPHONE HCL 1 MG/ML IJ SOLN: 1.0000 mg | Freq: Three times a day (TID) | INTRAMUSCULAR | Status: DC | PRN

## 2023-10-28 MED ORDER — OXYCODONE HCL ER 10 MG PO T12A
10.0000 mg | EXTENDED_RELEASE_TABLET | Freq: Two times a day (BID) | ORAL | Status: DC
  Administered 2023-10-28 – 2023-10-30 (×5): 10 mg via ORAL
  Filled 2023-10-28 (×5): qty 1

## 2023-10-28 MED ORDER — WARFARIN - PHARMACIST DOSING INPATIENT
Freq: Every day | Status: DC
Start: 2023-10-28 — End: 2023-10-30

## 2023-10-28 MED ORDER — LABETALOL HCL 5 MG/ML IV SOLN
INTRAVENOUS | Status: DC | PRN
  Administered 2023-10-28: 10 mg via INTRAVENOUS
  Administered 2023-10-28: 5 mg via INTRAVENOUS

## 2023-10-28 MED ORDER — TRANEXAMIC ACID-NACL 1000-0.7 MG/100ML-% IV SOLN
1000.0000 mg | Freq: Once | INTRAVENOUS | Status: AC
  Administered 2023-10-28: 1000 mg via INTRAVENOUS
  Filled 2023-10-28: qty 100

## 2023-10-28 MED ORDER — DOXYCYCLINE HYCLATE 100 MG PO TABS: 100.0000 mg | ORAL_TABLET | Freq: Two times a day (BID) | ORAL | Status: DC

## 2023-10-28 MED ORDER — PROPOFOL 10 MG/ML IV BOLUS
INTRAVENOUS | Status: DC | PRN
  Administered 2023-10-28: 150 mg via INTRAVENOUS
  Administered 2023-10-28: 50 mg via INTRAVENOUS

## 2023-10-28 MED ORDER — SODIUM CHLORIDE 0.9 % IV SOLN: INTRAVENOUS | Status: DC

## 2023-10-28 MED ORDER — SUGAMMADEX SODIUM 200 MG/2ML IV SOLN
INTRAVENOUS | Status: AC
  Filled 2023-10-28: qty 2

## 2023-10-28 MED ORDER — DOCUSATE SODIUM 100 MG PO CAPS
100.0000 mg | ORAL_CAPSULE | Freq: Two times a day (BID) | ORAL | Status: DC
  Administered 2023-10-28 – 2023-10-30 (×4): 100 mg via ORAL
  Filled 2023-10-28 (×4): qty 1

## 2023-10-28 MED ORDER — TRANEXAMIC ACID 1000 MG/10ML IV SOLN
INTRAVENOUS | Status: DC | PRN
  Administered 2023-10-28: 2000 mg via TOPICAL

## 2023-10-28 MED ORDER — METOCLOPRAMIDE HCL 5 MG PO TABS: 5.0000 mg | ORAL_TABLET | Freq: Three times a day (TID) | ORAL | Status: DC | PRN

## 2023-10-28 MED ORDER — WARFARIN SODIUM 5 MG PO TABS
7.5000 mg | ORAL_TABLET | Freq: Once | ORAL | Status: AC
  Administered 2023-10-28: 7.5 mg via ORAL
  Filled 2023-10-28: qty 1

## 2023-10-28 MED ORDER — PHENOL 1.4 % MT LIQD: 1.0000 | OROMUCOSAL | Status: DC | PRN

## 2023-10-28 MED ORDER — TRANEXAMIC ACID-NACL 1000-0.7 MG/100ML-% IV SOLN
1000.0000 mg | INTRAVENOUS | Status: AC
  Administered 2023-10-28: 1000 mg via INTRAVENOUS
  Filled 2023-10-28: qty 100

## 2023-10-28 MED ORDER — VANCOMYCIN HCL 1000 MG IV SOLR
INTRAVENOUS | Status: AC
  Filled 2023-10-28: qty 20

## 2023-10-28 MED ORDER — BUPIVACAINE-MELOXICAM ER 200-6 MG/7ML IJ SOLN
INTRAMUSCULAR | Status: DC | PRN
  Administered 2023-10-28: 400 mg

## 2023-10-28 MED ORDER — ISOPROPYL ALCOHOL 70 % SOLN
Status: DC | PRN
  Administered 2023-10-28: 1 via TOPICAL

## 2023-10-28 MED ORDER — BUPIVACAINE-MELOXICAM ER 400-12 MG/14ML IJ SOLN
INTRAMUSCULAR | Status: AC
  Filled 2023-10-28: qty 1

## 2023-10-28 MED ORDER — STERILE WATER FOR IRRIGATION IR SOLN
Status: DC | PRN
  Administered 2023-10-28: 2000 mL

## 2023-10-28 MED ORDER — ONDANSETRON HCL 4 MG/2ML IJ SOLN
4.0000 mg | Freq: Four times a day (QID) | INTRAMUSCULAR | Status: DC | PRN
  Administered 2023-10-28: 4 mg via INTRAVENOUS
  Filled 2023-10-28: qty 2

## 2023-10-28 MED ORDER — LIDOCAINE HCL (PF) 2 % IJ SOLN
INTRAMUSCULAR | Status: DC | PRN
  Administered 2023-10-28: 100 mg via INTRADERMAL

## 2023-10-28 MED ORDER — INSULIN ASPART 100 UNIT/ML IJ SOLN: 0.0000 [IU] | INTRAMUSCULAR | Status: DC | PRN

## 2023-10-28 MED ORDER — FENTANYL CITRATE (PF) 50 MCG/ML IJ SOSY
25.0000 ug | PREFILLED_SYRINGE | INTRAMUSCULAR | Status: DC | PRN
  Administered 2023-10-28 (×3): 50 ug via INTRAVENOUS

## 2023-10-28 MED ORDER — ONDANSETRON HCL 4 MG PO TABS: 4.0000 mg | ORAL_TABLET | Freq: Four times a day (QID) | ORAL | Status: DC | PRN

## 2023-10-28 MED ORDER — CEFAZOLIN SODIUM-DEXTROSE 2-4 GM/100ML-% IV SOLN
2.0000 g | INTRAVENOUS | Status: AC
  Administered 2023-10-28: 2 g via INTRAVENOUS
  Filled 2023-10-28: qty 100

## 2023-10-28 MED ORDER — INFLUENZA VAC SPLIT HIGH-DOSE 0.5 ML IM SUSY
0.5000 mL | PREFILLED_SYRINGE | INTRAMUSCULAR | Status: AC
  Administered 2023-10-29: 0.5 mL via INTRAMUSCULAR
  Filled 2023-10-28: qty 0.5

## 2023-10-28 MED ORDER — ROCURONIUM BROMIDE 10 MG/ML (PF) SYRINGE
PREFILLED_SYRINGE | INTRAVENOUS | Status: DC | PRN
  Administered 2023-10-28: 80 mg via INTRAVENOUS

## 2023-10-28 MED ORDER — MIDAZOLAM HCL 2 MG/2ML IJ SOLN
INTRAMUSCULAR | Status: AC
  Filled 2023-10-28: qty 2

## 2023-10-28 MED ORDER — 0.9 % SODIUM CHLORIDE (POUR BTL) OPTIME
TOPICAL | Status: DC | PRN
  Administered 2023-10-28: 1000 mL

## 2023-10-28 MED ORDER — ROCURONIUM BROMIDE 10 MG/ML (PF) SYRINGE
PREFILLED_SYRINGE | INTRAVENOUS | Status: AC
  Filled 2023-10-28: qty 10

## 2023-10-28 MED ORDER — POVIDONE-IODINE 10 % EX SWAB
2.0000 | Freq: Once | CUTANEOUS | Status: AC
  Administered 2023-10-28: 2 via TOPICAL

## 2023-10-28 MED ORDER — ONDANSETRON HCL 4 MG/2ML IJ SOLN: 4.0000 mg | Freq: Once | INTRAMUSCULAR | Status: DC | PRN

## 2023-10-28 MED ORDER — PREGABALIN 25 MG PO CAPS
25.0000 mg | ORAL_CAPSULE | Freq: Three times a day (TID) | ORAL | Status: DC
  Administered 2023-10-28 – 2023-10-30 (×6): 25 mg via ORAL
  Filled 2023-10-28 (×6): qty 1

## 2023-10-28 MED ORDER — ORAL CARE MOUTH RINSE: 15.0000 mL | Freq: Once | OROMUCOSAL | Status: AC

## 2023-10-28 SURGICAL SUPPLY — 56 items
BAG COUNTER SPONGE SURGICOUNT (BAG) IMPLANT
BAG ZIPLOCK 12X15 (MISCELLANEOUS) ×1 IMPLANT
BLADE SAG 18X100X1.27 (BLADE) ×1 IMPLANT
BLADE SAW SAG 35X64 .89 (BLADE) ×1 IMPLANT
BLADE SAW SGTL 13.0X1.19X90.0M (BLADE) ×1 IMPLANT
BNDG ELASTIC 6INX 5YD STR LF (GAUZE/BANDAGES/DRESSINGS) IMPLANT
BNDG GAUZE DERMACEA FLUFF 4 (GAUZE/BANDAGES/DRESSINGS) IMPLANT
BOWL SMART MIX CTS (DISPOSABLE) IMPLANT
CEMENT BONE REFOBACIN R1X40 US (Cement) IMPLANT
COMPONET TIB PS KNEE E 0D LT (Joint) IMPLANT
COOLER ICEMAN CLASSIC (MISCELLANEOUS) ×1 IMPLANT
COVER SURGICAL LIGHT HANDLE (MISCELLANEOUS) ×1 IMPLANT
CUFF TRNQT CYL 34X4.125X (TOURNIQUET CUFF) ×1 IMPLANT
DERMABOND ADVANCED .7 DNX12 (GAUZE/BANDAGES/DRESSINGS) IMPLANT
DRAPE IMP U-DRAPE 54X76 (DRAPES) ×1 IMPLANT
DRAPE INCISE IOBAN 66X45 STRL (DRAPES) ×1 IMPLANT
DRAPE SHEET LG 3/4 BI-LAMINATE (DRAPES) ×1 IMPLANT
DRAPE U-SHAPE 47X51 STRL (DRAPES) ×1 IMPLANT
DRSG AQUACEL AG ADV 3.5X10 (GAUZE/BANDAGES/DRESSINGS) IMPLANT
DURAPREP 26ML APPLICATOR (WOUND CARE) ×3 IMPLANT
ELECT PENCIL ROCKER SW 15FT (MISCELLANEOUS) ×1 IMPLANT
ELECT REM PT RETURN 15FT ADLT (MISCELLANEOUS) ×1 IMPLANT
EVACUATOR 1/8 PVC DRAIN (DRAIN) IMPLANT
FEMORAL KNEE COMP SZ 8STD LT (Knees) IMPLANT
GAUZE SPONGE 4X4 12PLY STRL (GAUZE/BANDAGES/DRESSINGS) IMPLANT
GLOVE BIOGEL PI IND STRL 7.0 (GLOVE) ×2 IMPLANT
GLOVE BIOGEL PI IND STRL 7.5 (GLOVE) ×1 IMPLANT
GLOVE ECLIPSE 7.0 STRL STRAW (GLOVE) ×3 IMPLANT
GLOVE SURG SYN 7.5 PF PI (GLOVE) ×5 IMPLANT
GOWN STRL SURGICAL XL XLNG (GOWN DISPOSABLE) ×2 IMPLANT
HOOD PEEL AWAY T7 (MISCELLANEOUS) ×3 IMPLANT
INSERT MED AS PERS SZ 8-11 LT (Insert) IMPLANT
KIT TURNOVER KIT A (KITS) ×1 IMPLANT
MARKER SKIN DUAL TIP RULER LAB (MISCELLANEOUS) ×2 IMPLANT
NDL SAFETY ECLIPSE 18X1.5 (NEEDLE) IMPLANT
NDL SPNL 18GX3.5 QUINCKE PK (NEEDLE) ×1 IMPLANT
NEEDLE SPNL 18GX3.5 QUINCKE PK (NEEDLE) ×1 IMPLANT
NS IRRIG 1000ML POUR BTL (IV SOLUTION) ×2 IMPLANT
PAD COLD SHLDR WRAP-ON (PAD) ×1 IMPLANT
PIN DRILL HDLS TROCAR 75 4PK (PIN) IMPLANT
PROTECTOR NERVE ULNAR (MISCELLANEOUS) ×1 IMPLANT
SCREW FEMALE HEX FIX 25X2.5 (ORTHOPEDIC DISPOSABLE SUPPLIES) IMPLANT
SET HNDPC FAN SPRY TIP SCT (DISPOSABLE) ×1 IMPLANT
SOLUTION PRONTOSAN WOUND 350ML (IRRIGATION / IRRIGATOR) ×1 IMPLANT
SPIKE FLUID TRANSFER (MISCELLANEOUS) ×2 IMPLANT
STEM POLY PAT PLY 32M KNEE (Knees) IMPLANT
STRIP CLOSURE SKIN 1/2X4 (GAUZE/BANDAGES/DRESSINGS) ×2 IMPLANT
SUT ETHILON 2 0 PS N (SUTURE) ×3 IMPLANT
SUT MNCRL AB 3-0 PS2 18 (SUTURE) IMPLANT
SUT STRATAFIX PDS+ 0 24IN (SUTURE) IMPLANT
SUT VIC AB 0 CT1 36 (SUTURE) ×1 IMPLANT
SUT VIC AB 1 CTX36XBRD ANBCTR (SUTURE) ×1 IMPLANT
SUT VIC AB 2-0 CT1 TAPERPNT 27 (SUTURE) ×3 IMPLANT
SYR 50ML LL SCALE MARK (SYRINGE) ×1 IMPLANT
TRAY CATH INTERMITTENT SS 16FR (CATHETERS) IMPLANT
WRAP KNEE MAXI GEL POST OP (GAUZE/BANDAGES/DRESSINGS) ×1 IMPLANT

## 2023-10-28 NOTE — TOC Transition Note (Signed)
 Transition of Care [facility]) - Discharge Note   Patient Details  Name: Virginia Hunt MRN: [id number] Date of Birth: 1945/09/30  Transition of Care [facility]) CM/SW Contact:  NORMAN ASPEN, LCSW Phone Number: 10/28/2023, 3:02 PM   Clinical Narrative:     Met with pt and confirming need for RW and no DME agency preference.  Order placed with [facility] for delivery to room prior to dc.  Pt aware that HHPT prearranged with [facility] via ortho MD office prior to surgery.  No further IP CM needs.  Final next level of care: Home w Home Health Services Barriers to Discharge: No Barriers Identified   Patient Goals and CMS Choice Patient states their goals for this hospitalization and ongoing recovery are:: return home          Discharge Placement                       Discharge Plan and Services Additional resources added to the After Visit Summary for                  DME Arranged: Walker rolling DME Agency: [facility] Date DME Agency Contacted: 10/28/23 Time DME Agency Contacted: 1500 Representative spoke with at DME Agency: [facility] HH Arranged: PT HH Agency: [facility]        Social Drivers of Health (SDOH) Interventions SDOH Screenings   Food Insecurity: No Food Insecurity (10/10/2023)   Received from [facility]  Housing: Low Risk  (10/10/2023)   Received from [facility]  Transportation Needs: No Transportation Needs (10/10/2023)   Received from [facility]  Utilities: Not At Risk (10/10/2023)   Received from [facility]  Financial Resource Strain: Low Risk  (10/10/2023)   Received from [facility]  Physical Activity: Inactive (08/03/2018)   Received from [facility]  Social Connections: Unknown (08/03/2018)   Received from [facility]  Stress: No Stress Concern Present (08/03/2018)   Received from [facility]  Tobacco Use: Medium Risk (10/28/2023)     Readmission Risk Interventions     No data to display

## 2023-10-28 NOTE — Progress Notes (Signed)
   10/28/23 2123  Vitals  Temp 98.3 F (36.8 C)  Temp Source Oral  BP (!) 167/86  MAP (mmHg) 110  BP Method Automatic  Pulse Rate (!) 111  Pulse Rate Source Monitor  MEWS COLOR  MEWS Score Color Yellow  Oxygen Therapy  SpO2 97 %  MEWS Score  MEWS Temp 0  MEWS Systolic 0  MEWS Pulse 2  MEWS RR 0  MEWS LOC 0  MEWS Score 2  Provider Notification  Provider Name/Title Lavanda Horns, NP  Date Provider Notified 10/28/23  Time Provider Notified 2125  Method of Notification Page  Notification Reason Other (Comment) (yellow MEWS)  Provider response No new orders  Date of Provider Response 10/28/23  Time of Provider Response 2129

## 2023-10-28 NOTE — Progress Notes (Addendum)
 PHARMACY - ANTICOAGULATION CONSULT NOTE  Pharmacy Consult for warfarin Indication: hx DVT/PE (May 2025)  Allergies  Allergen Reactions   Amlodipine Cough   Nifedipine Swelling   Codeine Hives    Hallucinate    Ibuprofen  Hives   Lisinopril Swelling   Morphine And Codeine Itching   Tylenol  [Acetaminophen ] Hives    Patient Measurements: Weight: 106.1 kg (234 lb)  Vital Signs: Temp: 98.8 F (37.1 C) (10/10 0552) Temp Source: Oral (10/10 0552) BP: 198/88 (10/10 0612) Pulse Rate: 87 (10/10 0552)  Labs: Recent Labs    10/28/23 0602  LABPROT 14.8  INR 1.1    CrCl cannot be calculated (Patient's most recent lab result is older than the maximum 21 days allowed.).   Medical History: Past Medical History:  Diagnosis Date   Arthritis    Asthma    Cancer (HCC)    Carpal tunnel syndrome    Complication of anesthesia    Diabetes mellitus without complication (HCC)    pre diabetes   Diverticulitis    Dyspnea    Fibromyalgia    Hypertension     Medications:  Warfarin 3.75 mg Mon, Wed and 7.5 mg all other days - last dose 10/5 Enoxaparin 100 mg BID (bridge for surgery) - last dose 10/9 am  Assessment: 78 year old female on warfarin PTA for DVT and PE in May 2025. She held her warfarin 10/5 and transitioned to Lovenox bridge in anticipation of orthopedic procedure, right TKA 10/10. Pharmacy consulted to resume warfarin.  Goal of Therapy:  INR 2-3 Monitor platelets by anticoagulation protocol: Yes   Plan:  -Warfarin to resume at home dose - 7.5 mg this evening (per d/w Dr. Jerri, ok to start TODAY POD 0) -Daily INR -Follow up plan for resumption of Lovenox bridge  Stefano MARLA Bologna, PharmD, BCPS Clinical Pharmacist 10/28/2023 8:04 AM

## 2023-10-28 NOTE — Discharge Instructions (Signed)

## 2023-10-28 NOTE — Evaluation (Signed)
 Physical Therapy Evaluation Patient Details Name: Virginia Hunt MRN: [id number] DOB: 01-Jan-1946 Today's Date: 10/28/2023  History of Present Illness  Pt s/p L TKR and with hx of DM, and Fibromyalgia  Clinical Impression  Pt s/p L TKR and presents with decreased L LE strength/ROM and post op pain limiting functional mobility.  Pt motivated and should progress well to dc home with family assist and follow up HHPT.        If plan is discharge home, recommend the following: A little help with walking and/or transfers;A little help with bathing/dressing/bathroom;Assistance with cooking/housework;Assist for transportation;Help with stairs or ramp for entrance   Can travel by private vehicle        Equipment Recommendations Rolling walker (2 wheels)  Recommendations for Other Services       Functional Status Assessment Patient has had a recent decline in their functional status and demonstrates the ability to make significant improvements in function in a reasonable and predictable amount of time.     Precautions / Restrictions Precautions Precautions: Fall;Knee Restrictions Weight Bearing Restrictions Per Provider Order: Yes LLE Weight Bearing Per Provider Order: Weight bearing as tolerated      Mobility  Bed Mobility Overal bed mobility: Needs Assistance Bed Mobility: Supine to Sit     Supine to sit: Min assist, Mod assist, Used rails, HOB elevated     General bed mobility comments: cues for sequence and use of R LE to self assist    Transfers Overall transfer level: Needs assistance Equipment used: Rolling walker (2 wheels) Transfers: Sit to/from Stand Sit to Stand: Min assist, Mod assist, From elevated surface           General transfer comment: cues for LE management and use of UEs to self assist    Ambulation/Gait Ambulation/Gait assistance: Min assist Gait Distance (Feet): 28 Feet (and 15' into bathroom) Assistive device: Rolling walker (2 wheels) Gait  Pattern/deviations: Step-to pattern, Decreased step length - right, Decreased step length - left, Shuffle, Trunk flexed Gait velocity: decr     General Gait Details: cues for sequence, posture and position from [employer]            Wheelchair Mobility     Tilt Bed    Modified Rankin (Stroke Patients Only)       Balance Overall balance assessment: Needs assistance Sitting-balance support: No upper extremity supported Sitting balance-Leahy Scale: Good     Standing balance support: Bilateral upper extremity supported Standing balance-Leahy Scale: Poor                               Pertinent Vitals/Pain Pain Assessment Pain Assessment: 0-10 Pain Score: 4  Pain Location: L knee Pain Descriptors / Indicators: Aching, Sore Pain Intervention(s): Limited activity within patient's tolerance, Monitored during session, Premedicated before session, Ice applied    Home Living Family/patient expects to be discharged to:: Private residence Living Arrangements: Children Available Help at Discharge: Family;Available PRN/intermittently (can be available initially 24/7 dependent on dc date) Type of Home: Apartment Home Access: Level entry         Home Equipment: None      Prior Function Prior Level of Function : Independent/Modified Independent                     Extremity/Trunk Assessment   Upper Extremity Assessment Upper Extremity Assessment: Overall WFL for tasks assessed    Lower Extremity Assessment Lower  Extremity Assessment: LLE deficits/detail    Cervical / Trunk Assessment Cervical / Trunk Assessment: Normal  Communication   Communication Communication: No apparent difficulties    Cognition Arousal: Alert Behavior During Therapy: WFL for tasks assessed/performed   PT - Cognitive impairments: No apparent impairments                         Following commands: Intact       Cueing Cueing Techniques: Verbal cues      General Comments      Exercises Total Joint Exercises Ankle Circles/Pumps: AROM, Both, 15 reps, Supine   Assessment/Plan    PT Assessment Patient needs continued PT services  PT Problem List Decreased strength;Decreased range of motion;Decreased activity tolerance;Decreased balance;Decreased mobility;Decreased knowledge of use of DME;Obesity;Pain       PT Treatment Interventions DME instruction;Gait training;Functional mobility training;Therapeutic activities;Therapeutic exercise;Balance training;Patient/family education    PT Goals (Current goals can be found in the Care Plan section)  Acute Rehab PT Goals Patient Stated Goal: REgain IND PT Goal Formulation: With patient Time For Goal Achievement: 11/04/23 Potential to Achieve Goals: Good    Frequency 7X/week     Co-evaluation               AM-PAC PT 6 Clicks Mobility  Outcome Measure Help needed turning from your back to your side while in a flat bed without using bedrails?: A Little Help needed moving from lying on your back to sitting on the side of a flat bed without using bedrails?: A Little Help needed moving to and from a bed to a chair (including a wheelchair)?: A Little Help needed standing up from a chair using your arms (e.g., wheelchair or bedside chair)?: A Little Help needed to walk in hospital room?: A Little Help needed climbing 3-5 steps with a railing? : A Lot 6 Click Score: 17    End of Session Equipment Utilized During Treatment: Gait belt Activity Tolerance: Patient tolerated treatment well Patient left: Other (comment) (bathroom on comode) Nurse Communication: Mobility status PT Visit Diagnosis: Difficulty in walking, not elsewhere classified (R26.2)    Time: 8644-8575 PT Time Calculation (min) (ACUTE ONLY): 29 min   Charges:   PT Evaluation $PT Eval Low Complexity: 1 Low PT Treatments $Gait Training: 8-22 mins PT General Charges $$ ACUTE PT VISIT: 1 Visit          [facility] PT Acute Rehabilitation Services Office [phone]   Yaseen Gilberg 10/28/2023, 3:38 PM

## 2023-10-28 NOTE — Anesthesia Postprocedure Evaluation (Signed)
 Anesthesia Post Note  Patient: Virginia Hunt  Procedure(s) Performed: ARTHROPLASTY, KNEE, TOTAL (Left: Knee)     Patient location during evaluation: PACU Anesthesia Type: Regional and General Level of consciousness: awake Pain management: pain level controlled Vital Signs Assessment: post-procedure vital signs reviewed and stable Respiratory status: spontaneous breathing Cardiovascular status: blood pressure returned to baseline Postop Assessment: no apparent nausea or vomiting Anesthetic complications: no   No notable events documented.                  Lauraine KATHEE [place]

## 2023-10-28 NOTE — Transfer of Care (Signed)
 Immediate Anesthesia Transfer of Care Note  Patient: Virginia Hunt  Procedure(s) Performed: ARTHROPLASTY, KNEE, TOTAL (Left: Knee)  Patient Location: PACU  Anesthesia Type:General  Level of Consciousness: awake, alert , oriented, and patient cooperative  Airway & Oxygen Therapy: Patient Spontanous Breathing and Patient connected to face mask oxygen  Post-op Assessment: Report given to RN and Post -op Vital signs reviewed and stable  Post vital signs: Reviewed and stable  Last Vitals:  Vitals Value Taken Time  BP 191/110 10/28/23 09:52  Temp    Pulse 68 10/28/23 09:57  Resp 14 10/28/23 09:57  SpO2 98 % 10/28/23 09:57  Vitals shown include unfiled device data.  Last Pain:  Vitals:   10/28/23 0624  TempSrc:   PainSc: 4          Complications: No notable events documented.

## 2023-10-28 NOTE — Anesthesia Preprocedure Evaluation (Addendum)
 Anesthesia Evaluation  Patient identified by MRN, date of birth, ID band Patient awake    Reviewed: Allergy & Precautions, NPO status , Patient's Chart, lab work & pertinent test results  History of Anesthesia Complications Negative for: history of anesthetic complications  Airway Mallampati: II  TM Distance: >3 FB Neck ROM: Full    Dental  (+) Teeth Intact, Dental Advisory Given   Pulmonary asthma , sleep apnea , former smoker, PE   breath sounds clear to auscultation       Cardiovascular hypertension, + DVT   Rhythm:Regular Rate:Normal     Neuro/Psych    GI/Hepatic ,neg GERD  ,,  Endo/Other  diabetes, Type 2    Renal/GU      Musculoskeletal  (+) Arthritis ,  Fibromyalgia -  Abdominal   Peds  Hematology   Anesthesia Other Findings   Reproductive/Obstetrics                              Anesthesia Physical Anesthesia Plan  ASA: 2  Anesthesia Plan: Spinal and Regional   Post-op Pain Management:    Induction:   PONV Risk Score and Plan: 2 and Propofol infusion and Treatment may vary due to age or medical condition  Airway Management Planned: Simple Face Mask  Additional Equipment:   Intra-op Plan:   Post-operative Plan:   Informed Consent:      Dental advisory given  Plan Discussed with: CRNA and Surgeon  Anesthesia Plan Comments: (Given patient's high-dose Lovenox, will defer spinal since her last dose was less than 24 hours prior to surgery.)         Anesthesia Quick Evaluation

## 2023-10-28 NOTE — H&P (Signed)
 PREOPERATIVE H&P  Chief Complaint: left knee osteoarthritis  HPI: Virginia Hunt is a 78 y.o. female who presents for surgical treatment of left knee osteoarthritis.  She denies any changes in medical history.  Past Surgical History:  Procedure Laterality Date  . ABDOMINAL HYSTERECTOMY    . CHOLECYSTECTOMY    . KNEE SURGERY    . TONSILLECTOMY     Social History   Socioeconomic History  . Marital status: Widowed    Spouse name: Not on file  . Number of children: Not on file  . Years of education: Not on file  . Highest education level: Not on file  Occupational History  . Not on file  Tobacco Use  . Smoking status: Former  . Smokeless tobacco: Never  Vaping Use  . Vaping status: Never Used  Substance and Sexual Activity  . Alcohol use: No  . Drug use: No  . Sexual activity: Not on file  Other Topics Concern  . Not on file  Social History Narrative  . Not on file   Social Drivers of Health   Financial Resource Strain: Low Risk  (10/10/2023)   Received from [facility]   Overall Financial Resource Strain (CARDIA)   . Difficulty of Paying Living Expenses: Not hard at all  Food Insecurity: No Food Insecurity (10/10/2023)   Received from [facility]   Hunger Vital Sign   . Within the past 12 months, you worried that your food would run out before you got the money to buy more.: Never true   . Within the past 12 months, the food you bought just didn't last and you didn't have money to get more.: Never true  Transportation Needs: No Transportation Needs (10/10/2023)   Received from [facility]   [facility] - Transportation   . In the past 12 months, has lack of transportation kept you from medical appointments or from getting medications?: No   . Lack of Transportation (Non-Medical): No  Physical Activity: Inactive (08/03/2018)   Received from [facility]   Exercise Vital Sign   . On average, how  many days per week do you engage in moderate to strenuous exercise (like a brisk walk)?: 0 days   . On average, how many minutes do you engage in exercise at this level?: 0 min  Stress: No Stress Concern Present (08/03/2018)   Received from [facility] - Occupational Stress Questionnaire   . Feeling of Stress : Not at all  Social Connections: Unknown (08/03/2018)   Received from [facility]   Social Connection and Isolation Panel   . In a typical week, how many times do you talk on the phone with family, friends, or neighbors?: Patient declined   . How often do you get together with friends or relatives?: Patient declined   . How often do you attend church or religious services?: Patient declined   . Do you belong to any clubs or organizations such as church groups, unions, fraternal or athletic groups, or school groups?: Patient declined   . How often do you attend meetings of the clubs or organizations you belong to?: Patient declined   . Are you married, widowed, divorced, separated, never married, or living with a partner?: Patient declined   Family History  Problem Relation Age of Onset  . Stroke Father   . COPD Father    Allergies  Allergen  Reactions  . Amlodipine Cough  . Nifedipine Swelling  . Codeine Hives    Hallucinate   . Ibuprofen  Hives  . Lisinopril Swelling  . Morphine And Codeine Itching  . Tylenol  [Acetaminophen ] Hives   Prior to Admission medications   Medication Sig Start Date End Date Taking? Authorizing Provider  cyanocobalamin (VITAMIN B12) 500 MCG tablet Take 500 mcg by mouth daily.   Yes [provider]  docusate sodium  (COLACE) 100 MG capsule Take 1 capsule (100 mg total) by mouth daily as needed. Patient not taking: Reported on 10/21/2023 10/11/23 10/10/24  Jule Ronal CROME, PA-C  doxycycline  (VIBRA -TABS) 100 MG tablet Take 1 tablet (100 mg total) by mouth 2 (two) times  daily. To be taken after surgery Patient not taking: Reported on 10/21/2023 10/11/23   Jule Ronal CROME, PA-C  enoxaparin (LOVENOX) 100 MG/ML injection Inject 100 mg into the skin every 12 (twelve) hours. 10/24/23  Yes [provider]  fluticasone (FLONASE) 50 MCG/ACT nasal spray Place 2 sprays into both nostrils in the morning and at bedtime. 03/08/14  Yes [provider]  loratadine (CLARITIN) 10 MG tablet Take 10 mg by mouth daily as needed for allergies.   Yes [provider]  losartan (COZAAR) 100 MG tablet Take 100 mg by mouth daily. 08/12/17 10/21/23 Yes [provider]  methocarbamol  (ROBAXIN ) 500 MG tablet Take 1 tablet (500 mg total) by mouth 2 (two) times daily as needed. Patient taking differently: Take 500 mg by mouth 2 (two) times daily as needed for muscle spasms. 10/11/23  Yes Jule Ronal CROME, PA-C  ondansetron  (ZOFRAN ) 4 MG tablet Take 1 tablet (4 mg total) by mouth every 8 (eight) hours as needed for nausea or vomiting. 10/11/23  Yes Jule Ronal CROME, PA-C  oxyCODONE  (ROXICODONE ) 5 MG immediate release tablet Take 1-2 tablets (5-10 mg total) by mouth 3 (three) times daily with meals as needed. To be taken after surgery Patient taking differently: Take 5 mg by mouth in the morning, at noon, in the evening, and at bedtime. 10/11/23 10/10/24 Yes Jule Ronal CROME, PA-C  pregabalin (LYRICA) 25 MG capsule Take 25 mg by mouth in the morning, at noon, in the evening, and at bedtime.   Yes [provider]  warfarin (COUMADIN) 7.5 MG tablet Take 3.5-7.5 mg by mouth See admin instructions. Take 3.75 mg on Monday and Wednesday and take 7.5 mg on Tuesday, Thursday-Sunday Patient not taking: Reported on 10/27/2023    [provider]     Positive ROS: All other systems have been reviewed and were otherwise negative with the exception of those mentioned in the HPI and as above.  Physical Exam: General: Alert, no acute distress Cardiovascular: No  pedal edema Respiratory: No cyanosis, no use of accessory musculature GI: abdomen soft Skin: No lesions in the area of chief complaint Neurologic: Sensation intact distally Psychiatric: Patient is competent for consent with normal mood and affect Lymphatic: no lymphedema  MUSCULOSKELETAL: exam stable  Assessment: left knee osteoarthritis  Plan: Plan for Procedure(s): ARTHROPLASTY, KNEE, TOTAL  The risks benefits and alternatives were discussed with the patient including but not limited to the risks of nonoperative treatment, versus surgical intervention including infection, bleeding, nerve injury,  blood clots, cardiopulmonary complications, morbidity, mortality, among others, and they were willing to proceed.   Ozell Cummins, MD 10/28/2023 6:05 AM

## 2023-10-28 NOTE — Progress Notes (Signed)
 Orthopedic Tech Progress Note Patient Details:  Virginia Hunt [birth year] [id number]  Ortho Devices Type of Ortho Device: Bone foam zero knee Ortho Device/Splint Location: LLE Ortho Device/Splint Interventions: Application   Post Interventions Patient Tolerated: Well  Massie BRAVO Chantella Creech 10/28/2023, 12:21 PM

## 2023-10-28 NOTE — Anesthesia Procedure Notes (Addendum)
 Anesthesia Regional Block: Adductor canal block   Pre-Anesthetic Checklist: , timeout performed,  Correct Patient, Correct Site, Correct Laterality,  Correct Procedure, Correct Position, site marked,  Risks and benefits discussed,  Surgical consent,  Pre-op evaluation,  At surgeon's request and post-op pain management  Laterality: Lower and Left  Prep: chloraprep       Needles:  Injection technique: Single-shot  Needle Type: Echogenic Stimulator Needle     Needle Length: 10cm  Needle Gauge: 20     Additional Needles:   Procedures:,,,, ultrasound used (permanent image in chart),, #20gu IV placed    Narrative:  Start time: 10/28/2023 7:28 AM End time: 10/28/2023 7:28 AM Injection made incrementally with aspirations every 5 mL.  Performed by: Personally  Anesthesiologist: Waddell Lauraine NOVAK, MD  Additional Notes: Timeout performed with bedside RN - Name, DOB, allergies and laterality confirmed by the patient and RN. Surgical marking performed/confirmed. Anticoagulation status and most recent platelet count reviewed. Patient placed in a frog-leg position and sedation (as documented by RN) given via PIV. Peripheral nerve block performed as documented above. VSS throughout (see Flowchart).

## 2023-10-28 NOTE — Op Note (Signed)
 Total Knee Arthroplasty Procedure Note  Preoperative diagnosis: Left knee osteoarthritis  Postoperative diagnosis:same  Operative findings: Complete loss articular cartilage from all joint surfaces Flexion contracture  Operative procedure: Left total knee arthroplasty. CPT [id number]  Surgeon: N. Ozell Cummins, MD  Assist: Ronal Morna Grave, PA-C; necessary for the timely completion of procedure and due to complexity of procedure.  Anesthesia: general, regional, local  Tourniquet time: see anesthesia record  Implants used: Zimmer persona Femur: CR 8 Tibia: E Patella: 32 mm Polyethylene: 11 mm medial congruent  Indication: Virginia Hunt is a 78 y.o. year old female with a history of knee pain. Having failed conservative management, the patient elected to proceed with a total knee arthroplasty.  We have reviewed the risk and benefits of the surgery and they elected to proceed after voicing understanding.  Procedure:  After informed consent was obtained and understanding of the risk were voiced including but not limited to bleeding, infection, damage to surrounding structures including nerves and vessels, blood clots, leg length inequality and the failure to achieve desired results, the operative extremity was marked with verbal confirmation of the patient in the holding area.   The patient was then brought to the operating room and transported to the operating room table in the supine position.  A tourniquet was applied to the operative extremity around the upper thigh. The operative limb was then prepped and draped in the usual sterile fashion and preoperative antibiotics were administered.  A time out was performed prior to the start of surgery confirming the correct extremity, preoperative antibiotic administration, as well as team members, implants and instruments available for the case. Correct surgical site was also confirmed with preoperative radiographs. The limb was then  elevated for exsanguination and the tourniquet was inflated. A midline incision was made and a standard medial parapatellar approach was performed.  The infrapatellar fat pad was removed.  Suprapatellar synovium was removed to reveal the anterior distal femoral cortex.  A medial peel was performed to release the capsule and the deep MCL off of the medial tibial plateau back to the semimembranosus.  The patella was then everted which showed complete loss of articular cartilage and was resected down to 14 mm and sized to a 32 mm.  A cover was placed on the patella for protection from retractors.  The knee was then brought into flexion and we then turned our attention to the femur.  The ACL was sacrificed.  Start site was drilled in the femur and the intramedullary distal femoral cutting guide was placed, set at 5 degrees valgus, taking 12 mm of distal resection for the flexion contracture. The distal cut was made. Osteophytes were then removed.  Next, the proximal tibial cutting guide was placed with appropriate slope, varus/valgus alignment and depth of resection.  The drop rod was attached to confirm that it was aimed at the second metatarsal.  The proximal tibial cut was made taking 2 mm off the low side. Gap blocks were then used to assess the extension gap and alignment, and appropriate soft tissue releases were performed. Attention was turned back to the femur, which was sized using the sizing guide to a size 8 standard. Appropriate rotation of the femoral component was determined using epicondylar axis, Whiteside's line, and assessing the flexion gap under ligament tension. The appropriate size 4-in-1 cutting block was placed and checked with an angel wing and cuts were made. Posterior femoral osteophytes and uncapped bone were then removed with the curved osteotome.  The menisci were removed.  Trial components were placed, and stability was checked in full extension, mid-flexion, and deep flexion.  PCL was  resected to balance the flexion space. I chose an 11 mm poly.  Proper tibial rotation was determined and marked.  The patella tracked well without a lateral release.  The femoral lugs were then drilled. Trial components were then removed and tibial preparation performed.  The trial tibia was pointed to the medial third of the tibial tubercle.  The tibia was sized for a size E component and prepared.  Trial components were removed.    The bony surfaces were irrigated with a pulse lavage and then dried. Bone cement was vacuum mixed on the back table, and the final components sized above were cemented into place.  Antibiotic irrigation was placed in the knee joint and soft tissues while the cement cured.  After cement had finished curing, excess cement was removed. The stability of the construct was re-evaluated throughout a range of motion and found to be acceptable. The trial liner was removed, the knee was copiously irrigated, and the knee was re-evaluated for any excess bone debris. The real polyethylene liner, 11 mm thick, was inserted and checked to ensure the locking mechanism had engaged appropriately. The tourniquet was deflated and hemostasis was achieved. The wound was irrigated with normal saline.  One gram of vancomycin powder was placed in the surgical bed.  Topical mixture of 0.25% bupivacaine and meloxicam  was placed in the joint for postoperative pain.  Medium HVAC was placed in the joint.  Capsular closure was performed with a #1 stratafix in flexion, subcutaneous fat closed with a 0 vicryl suture, then subcutaneous tissue closed with interrupted 2.0 vicryl suture. The skin was then closed with a 2.0 nylon and dermabond. A sterile dressing was applied.  The patient was awakened in the operating room and taken to recovery in stable condition. All sponge, needle, and instrument counts were correct at the end of the case.  Morna Grave was necessary for opening, closing, retracting, limb  positioning and overall facilitation and completion of the surgery.  Position: supine  Complications: none.  Time Out: performed   Drains/Packing: none Estimated blood loss: minimal Returned to Recovery Room: in good condition.   Mechanical VTE (DVT) Prophylaxis: sequential compression devices, TED thigh-high  Chemical VTE (DVT) Prophylaxis: resume warfarin POD 1  Fluid Replacement  Crystalloid: see anesthesia record Blood: none  FFP: none   Specimens Removed: 1 to pathology  Sponge and Instrument Count Correct? yes  PACU: portable radiograph - knee AP and Lateral  Plan/RTC: Return in 2 weeks for suture removal.  Weight Bearing/Load Lower Extremity: full   Implant Name Type Inv. Item Serial No. Manufacturer Lot No. LRB No. Used Action  CEMENT BONE REFOBACIN [id number] US  - [id number] Cement CEMENT BONE REFOBACIN [id number] US   ZIMMER RECON(ORTH,TRAU,BIO,SG) [id number] Left 2 Implanted  COMPONET TIB PS KNEE E 0D LT - [id number] Joint COMPONET TIB PS KNEE E 0D LT  ZIMMER RECON(ORTH,TRAU,BIO,SG) [id number] Left 1 Implanted  FEMORAL KNEE COMP SZ 8STD LT - [id number] Knees FEMORAL KNEE COMP SZ 8STD LT  ZIMMER RECON(ORTH,TRAU,BIO,SG) [id number] Left 1 Implanted  INSERT MED AS PERS SZ 8-11 LT - [id number] Insert INSERT MED AS PERS SZ 8-11 LT  ZIMMER [employer]ON(ORTH,TRAU,BIO,SG) [id number] Left 1 Implanted  STEM POLY PAT PLY 90M KNEE - [id number] Knees STEM POLY PAT PLY 90M KNEE  ZIMMER RECON(ORTH,TRAU,BIO,SG) [id number] Left 1 Implanted    N. Ozell Cummins, MD [facility] 9:17  AM

## 2023-10-28 NOTE — Anesthesia Procedure Notes (Addendum)
 Procedure Name: Intubation Date/Time: 10/28/2023 7:50 AM  Performed by: Franchot Delon RAMAN, CRNAPre-anesthesia Checklist: Patient identified, Emergency Drugs available, Suction available and Patient being monitored Patient Re-evaluated:Patient Re-evaluated prior to induction Oxygen Delivery Method: Circle System Utilized Preoxygenation: Pre-oxygenation with 100% oxygen Induction Type: IV induction Ventilation: Oral airway inserted - appropriate to patient size and Mask ventilation without difficulty Laryngoscope Size: Mac and 3 Grade View: Grade I Tube type: Oral Tube size: 7.0 mm Number of attempts: 1 Airway Equipment and Method: Stylet and Patient positioned with wedge pillow Placement Confirmation: ETT inserted through vocal cords under direct vision, positive ETCO2 and breath sounds checked- equal and bilateral Secured at: 22 cm Tube secured with: Tape Dental Injury: Teeth and Oropharynx as per pre-operative assessment

## 2023-10-29 DIAGNOSIS — M1712 Unilateral primary osteoarthritis, left knee: Secondary | ICD-10-CM | POA: Diagnosis not present

## 2023-10-29 LAB — GLUCOSE, CAPILLARY
Glucose-Capillary: 150 mg/dL — ABNORMAL HIGH (ref 70–99)
Glucose-Capillary: 191 mg/dL — ABNORMAL HIGH (ref 70–99)
Glucose-Capillary: 161 mg/dL — ABNORMAL HIGH (ref 70–99)

## 2023-10-29 LAB — PROTIME-INR
INR: 1.1 (ref 0.8–1.2)
Prothrombin Time: 14.9 s (ref 11.4–15.2)

## 2023-10-29 MED ORDER — WARFARIN SODIUM 5 MG PO TABS
10.0000 mg | ORAL_TABLET | Freq: Once | ORAL | Status: AC
  Administered 2023-10-29: 10 mg via ORAL
  Filled 2023-10-29: qty 2

## 2023-10-29 MED ORDER — ENOXAPARIN SODIUM 100 MG/ML IJ SOSY
100.0000 mg | PREFILLED_SYRINGE | Freq: Every day | INTRAMUSCULAR | Status: DC
  Administered 2023-10-29 – 2023-10-30 (×2): 100 mg via SUBCUTANEOUS
  Filled 2023-10-29 (×2): qty 1

## 2023-10-29 NOTE — Plan of Care (Signed)

## 2023-10-29 NOTE — Care Management Obs Status (Signed)
 MEDICARE OBSERVATION STATUS NOTIFICATION   Patient Details  Name: [place] Nolton MRN: [id number] Date of Birth: 1945/04/11   Medicare Observation Status Notification Given:  Yes    Sheri ONEIDA Sharps, LCSW 10/29/2023, 11:52 AM

## 2023-10-29 NOTE — Progress Notes (Signed)
 OT Cancellation Note  Patient Details Name: Virginia Hunt MRN: [id number] DOB: [birth year]   Cancelled Treatment:    Reason Eval/Treat Not Completed: (P) OT screened, no needs identified, will sign off, per PT Pt is ind, no acute OT needs, signing off.  [name] 10/29/2023, 11:14 AM

## 2023-10-29 NOTE — Progress Notes (Signed)
 PHARMACY - ANTICOAGULATION CONSULT NOTE  Pharmacy Consult for Warfarin Indication: h/o DVT/PE  Allergies  Allergen Reactions   Amlodipine Cough   Nifedipine Swelling   Codeine Hives    Hallucinate    Ibuprofen  Hives   Lisinopril Swelling   Morphine And Codeine Itching   Tylenol  [Acetaminophen ] Hives    Patient Measurements: Height: 5' 6.5 (168.9 cm) Weight: 106.1 kg (233 lb 14.5 oz) IBW/kg (Calculated) : 60.45 HEPARIN DW (KG): 84.7  Vital Signs: Temp: 98.1 F (36.7 C) (10/11 0407) Temp Source: Oral (10/11 0407) BP: 117/62 (10/11 0407) Pulse Rate: 87 (10/11 0407)  Labs: Recent Labs    10/28/23 0602 10/29/23 0654  LABPROT 14.8 14.9  INR 1.1 1.1    CrCl cannot be calculated (Patient's most recent lab result is older than the maximum 21 days allowed.).   Medical History: Past Medical History:  Diagnosis Date   Arthritis    Asthma    Cancer (HCC)    Carpal tunnel syndrome    Complication of anesthesia    Diabetes mellitus without complication (HCC)    pre diabetes   Diverticulitis    Dyspnea    Fibromyalgia    Hypertension     Assessment: AC/Heme: Warfarin PTA for h/o DVT/PE - INR 1.1. No CBC today  Goal of Therapy:  INR 2-3 Monitor platelets by anticoagulation protocol: Yes   Plan:  Coumadin 10mg  po x 1 today. Daily INR Resume Lovenox bridge?   Freedom Peddy Karoline Marina, PharmD, BCPS Clinical Staff Pharmacist Marina Salines Stillinger 10/29/2023,9:33 AM

## 2023-10-29 NOTE — Progress Notes (Signed)
   Subjective:  Patient reports pain as better today.  Today's  total administered Morphine Milligram Equivalents: 45  Objective:   VITALS:   Vitals:   10/28/23 1730 10/28/23 2123 10/29/23 0007 10/29/23 0407  BP: (!) 177/85 (!) 167/86 (!) 173/83 117/62  Pulse: 66 (!) 111 92 87  Resp: 14  (!) 22 20  Temp: 97.6 F (36.4 C) 98.3 F (36.8 C) 98.2 F (36.8 C) 98.1 F (36.7 C)  TempSrc: Oral Oral Oral Oral  SpO2: 100% 97% 99% 97%  Weight:      Height:        Neurovascular intact Sensation intact distally Intact pulses distally Dorsiflexion/Plantar flexion intact   Lab Results  Component Value Date   WBC 4.6 08/31/2023   HGB 13.8 08/31/2023   HCT 42.8 08/31/2023   MCV 89.0 08/31/2023   PLT 224 08/31/2023     Assessment/Plan:  1 Day Post-Op   - progressing well with PT - resume warfarin, goal INR 2-3, I have ordered daily lovenox as bridge (BID dosing poses high risk for bleeding), recheck PT/INR tomorrow morning - HVAC removed - could d/c home tomorrow if INR is trending appropriately  Virginia Hunt 10/29/2023, 10:15 AM

## 2023-10-29 NOTE — Progress Notes (Signed)
 Physical Therapy Treatment Patient Details Name: Virginia Hunt MRN: [id number] DOB: [birth year] Today's Date: 10/29/2023   History of Present Illness Pt s/p L TKR and with hx of DM, and Fibromyalgia    PT Comments  Pt continues very cooperative and progressing well with mobility.  Pt up to ambulate increased distance in hall, to bathroom for toileting with hand hygiene standing at sink, and HEP initiated.   If plan is discharge home, recommend the following: A little help with walking and/or transfers;A little help with bathing/dressing/bathroom;Assistance with cooking/housework;Assist for transportation;Help with stairs or ramp for entrance   Can travel by private vehicle        Equipment Recommendations  Rolling walker (2 wheels)    Recommendations for Other Services       Precautions / Restrictions Precautions Precautions: Fall;Knee Restrictions Weight Bearing Restrictions Per Provider Order: Yes     Mobility  Bed Mobility Overal bed mobility: Needs Assistance Bed Mobility: Supine to Sit     Supine to sit: Min assist     General bed mobility comments: cues for sequence and use of R LE to self assist    Transfers Overall transfer level: Needs assistance Equipment used: Rolling walker (2 wheels) Transfers: Sit to/from Stand Sit to Stand: Contact guard assist, Supervision           General transfer comment: cues for LE management and use of UEs to self assist    Ambulation/Gait Ambulation/Gait assistance: Contact guard assist Gait Distance (Feet): 85 Feet (and 15' x 2 to/from bathroom) Assistive device: Rolling walker (2 wheels) Gait Pattern/deviations: Step-to pattern, Decreased step length - right, Decreased step length - left, Shuffle, Trunk flexed Gait velocity: decr     General Gait Details: cues for sequence, posture and position from [employer]             Wheelchair Mobility     Tilt Bed    Modified Rankin (Stroke Patients  Only)       Balance Overall balance assessment: Needs assistance Sitting-balance support: No upper extremity supported Sitting balance-Leahy Scale: Good     Standing balance support: Bilateral upper extremity supported Standing balance-Leahy Scale: Poor                              Communication Communication Communication: No apparent difficulties  Cognition Arousal: Alert Behavior During Therapy: WFL for tasks assessed/performed   PT - Cognitive impairments: No apparent impairments                         Following commands: Intact      Cueing Cueing Techniques: Verbal cues  Exercises Total Joint Exercises Ankle Circles/Pumps: AROM, Both, 15 reps, Supine Quad Sets: AROM, Both, 10 reps, Supine Heel Slides: AAROM, Left, 15 reps, Supine Straight Leg Raises: AAROM, AROM, Left, 15 reps, Supine    General Comments        Pertinent Vitals/Pain Pain Assessment Pain Assessment: 0-10 Pain Score: 5  Pain Location: L knee Pain Descriptors / Indicators: Aching, Sore Pain Intervention(s): Limited activity within patient's tolerance, Monitored during session, Premedicated before session (pt declines ice)    Home Living                          Prior Function            PT Goals (current goals can now be  found in the care plan section) Acute Rehab PT Goals Patient Stated Goal: REgain IND PT Goal Formulation: With patient Time For Goal Achievement: 11/04/23 Potential to Achieve Goals: Good Progress towards PT goals: Progressing toward goals    Frequency    7X/week      PT Plan      Co-evaluation              AM-PAC PT 6 Clicks Mobility   Outcome Measure  Help needed turning from your back to your side while in a flat bed without using bedrails?: A Little Help needed moving from lying on your back to sitting on the side of a flat bed without using bedrails?: A Little Help needed moving to and from a bed to a chair  (including a wheelchair)?: A Little Help needed standing up from a chair using your arms (e.g., wheelchair or bedside chair)?: A Little Help needed to walk in hospital room?: A Little Help needed climbing 3-5 steps with a railing? : A Lot 6 Click Score: 17    End of Session Equipment Utilized During Treatment: Gait belt Activity Tolerance: Patient tolerated treatment well Patient left: in chair;with call bell/phone within reach;with chair alarm set Nurse Communication: Mobility status PT Visit Diagnosis: Difficulty in walking, not elsewhere classified (R26.2)     Time: 9147-9061 PT Time Calculation (min) (ACUTE ONLY): 46 min  Charges:    $Gait Training: 8-22 mins $Therapeutic Exercise: 8-22 mins $Therapeutic Activity: 8-22 mins PT General Charges $$ ACUTE PT VISIT: 1 Visit                     [facility] PT Acute Rehabilitation Services Office [phone]    Tamy Accardo 10/29/2023, 12:56 PM

## 2023-10-29 NOTE — Plan of Care (Signed)
 Patient admission education progressing

## 2023-10-29 NOTE — Progress Notes (Signed)
 Physical Therapy Treatment Patient Details Name: [place] Hunt MRN: [id number] DOB: 09-06-1945 Today's Date: 10/29/2023   History of Present Illness Pt s/p L TKR and with hx of DM, and Fibromyalgia    PT Comments  Pt received exiting bathroom with CNA and agreeable to ambulation in hall.  Pt also negotiated stairs with rail and cane.  Pt hopeful for dc home tomorrow.    If plan is discharge home, recommend the following: A little help with walking and/or transfers;A little help with bathing/dressing/bathroom;Assistance with cooking/housework;Assist for transportation;Help with stairs or ramp for entrance   Can travel by private vehicle        Equipment Recommendations  Rolling walker (2 wheels)    Recommendations for Other Services       Precautions / Restrictions Precautions Precautions: Fall;Knee Restrictions Weight Bearing Restrictions Per Provider Order: Yes LLE Weight Bearing Per Provider Order: Weight bearing as tolerated     Mobility  Bed Mobility Overal bed mobility: Needs Assistance Bed Mobility: Sit to Supine     Supine to sit: Min assist Sit to supine: Min assist   General bed mobility comments: cues for sequence and use of R LE to self assist    Transfers Overall transfer level: Needs assistance Equipment used: Rolling walker (2 wheels) Transfers: Sit to/from Stand Sit to Stand: Contact guard assist, Supervision           General transfer comment: cues for LE management and use of UEs to self assist    Ambulation/Gait Ambulation/Gait assistance: Contact guard assist Gait Distance (Feet): 65 Feet Assistive device: Rolling walker (2 wheels) Gait Pattern/deviations: Step-to pattern, Decreased step length - right, Decreased step length - left, Shuffle, Trunk flexed Gait velocity: decr     General Gait Details: cues for posture and position from RW   Stairs Stairs: Yes Stairs assistance: Min assist Stair Management: One rail Right, Step to  pattern, Forwards, With cane Number of Stairs: 3 General stair comments: cues for sequence and cane placement   Wheelchair Mobility     Tilt Bed    Modified Rankin (Stroke Patients Only)       Balance Overall balance assessment: Needs assistance Sitting-balance support: No upper extremity supported Sitting balance-Leahy Scale: Good     Standing balance support: Single extremity supported Standing balance-Leahy Scale: Poor                              Communication Communication Communication: No apparent difficulties  Cognition Arousal: Alert Behavior During Therapy: WFL for tasks assessed/performed   PT - Cognitive impairments: No apparent impairments                         Following commands: Intact      Cueing Cueing Techniques: Verbal cues  Exercises Total Joint Exercises Ankle Circles/Pumps: AROM, Both, 15 reps, Supine Quad Sets: AROM, Both, 10 reps, Supine Heel Slides: AAROM, Left, 15 reps, Supine Straight Leg Raises: AAROM, AROM, Left, 15 reps, Supine    General Comments        Pertinent Vitals/Pain Pain Assessment Pain Assessment: 0-10 Pain Score: 5  Pain Location: L knee Pain Descriptors / Indicators: Aching, Sore Pain Intervention(s): Limited activity within patient's tolerance, Monitored during session, Premedicated before session, Ice applied    Home Living  Prior Function            PT Goals (current goals can now be found in the care plan section) Acute Rehab PT Goals Patient Stated Goal: REgain IND PT Goal Formulation: With patient Time For Goal Achievement: 11/04/23 Potential to Achieve Goals: Good Progress towards PT goals: Progressing toward goals    Frequency    7X/week      PT Plan      Co-evaluation              AM-PAC PT 6 Clicks Mobility   Outcome Measure  Help needed turning from your back to your side while in a flat bed without using  bedrails?: A Little Help needed moving from lying on your back to sitting on the side of a flat bed without using bedrails?: A Little Help needed moving to and from a bed to a chair (including a wheelchair)?: A Little Help needed standing up from a chair using your arms (e.g., wheelchair or bedside chair)?: A Little Help needed to walk in hospital room?: A Little Help needed climbing 3-5 steps with a railing? : A Little 6 Click Score: 18    End of Session Equipment Utilized During Treatment: Gait belt Activity Tolerance: Patient tolerated treatment well Patient left: in bed;with call bell/phone within reach;with bed alarm set Nurse Communication: Mobility status PT Visit Diagnosis: Difficulty in walking, not elsewhere classified (R26.2)     Time: 8667-8647 PT Time Calculation (min) (ACUTE ONLY): 20 min  Charges:    $Gait Training: 8-22 mins $Therapeutic Exercise: 8-22 mins $Therapeutic Activity: 8-22 mins PT General Charges $$ ACUTE PT VISIT: 1 Visit                     [facility] PT Acute Rehabilitation Services Office [phone]    Becky Colan 10/29/2023, 1:55 PM

## 2023-10-30 DIAGNOSIS — M1712 Unilateral primary osteoarthritis, left knee: Secondary | ICD-10-CM | POA: Diagnosis not present

## 2023-10-30 LAB — PROTIME-INR
INR: 1.4 — ABNORMAL HIGH (ref 0.8–1.2)
Prothrombin Time: 17.6 s — ABNORMAL HIGH (ref 11.4–15.2)

## 2023-10-30 NOTE — Plan of Care (Signed)
  Problem: Activity: Goal: Risk for activity intolerance will decrease Outcome: Progressing   Problem: Activity: Goal: Ability to avoid complications of mobility impairment will improve Outcome: Progressing Goal: Range of joint motion will improve Outcome: Progressing   Problem: Clinical Measurements: Goal: Postoperative complications will be avoided or minimized Outcome: Progressing   Problem: Pain Management: Goal: Pain level will decrease with appropriate interventions Outcome: Progressing

## 2023-10-30 NOTE — Progress Notes (Signed)
 Subjective: 2 Days Post-Op Procedure(s) (LRB): ARTHROPLASTY, KNEE, TOTAL (Left) Patient reports pain as mild.    Objective: Vital signs in last 24 hours: Temp:  [98 F (36.7 C)-99.5 F (37.5 C)] 99.2 F (37.3 C) (10/12 0645) Pulse Rate:  [79-100] 88 (10/12 0645) Resp:  [17-18] 18 (10/12 0645) BP: (130-191)/(61-74) 149/70 (10/12 0645) SpO2:  [94 %-98 %] 94 % (10/12 0645)  Intake/Output from previous day: 10/11 0701 - 10/12 0700 In: 1260 [P.O.:1260] Out: -  Intake/Output this shift: No intake/output data recorded.  No results for input(s): HGB in the last 72 hours. No results for input(s): WBC, RBC, HCT, PLT in the last 72 hours. No results for input(s): NA, K, CL, CO2, BUN, CREATININE, GLUCOSE, CALCIUM in the last 72 hours. Recent Labs    10/29/23 0654 10/30/23 0315  INR 1.1 1.4*    Neurologically intact Neurovascular intact Sensation intact distally Intact pulses distally Dorsiflexion/Plantar flexion intact Incision: dressing C/D/I No cellulitis present Compartment soft   Assessment/Plan: 2 Days Post-Op Procedure(s) (LRB): ARTHROPLASTY, KNEE, TOTAL (Left) Advance diet Up with therapy D/C IV fluids Discharge home with home health once cleared by PT (likely after first PT session today) INR 1.4 this am.  Patient has instructions on post-op lovenox bridge from hematologist for once she gets home.  Has fu appt with hematologist on 10/15 WBAT LLE       [name] 10/30/2023, 7:32 AM

## 2023-10-30 NOTE — Discharge Summary (Signed)
 Patient ID: Virginia Hunt MRN: [id number] DOB/AGE: Jul 02, 1945 78 y.o.  Admit date: 10/28/2023 Discharge date: 10/30/2023  Admission Diagnoses:  Principal Problem:   Status post total left knee replacement Active Problems:   Primary osteoarthritis of left knee   Discharge Diagnoses:  Same  Past Medical History:  Diagnosis Date   Arthritis    Asthma    Cancer (HCC)    Carpal tunnel syndrome    Complication of anesthesia    Diabetes mellitus without complication (HCC)    pre diabetes   Diverticulitis    Dyspnea    Fibromyalgia    Hypertension     Surgeries: Procedure(s): ARTHROPLASTY, KNEE, TOTAL on 10/28/2023   Consultants:   Discharged Condition: Improved  Hospital Course: Virginia Hunt is an 78 y.o. female who was admitted 10/28/2023 for operative treatment ofStatus post total left knee replacement. Patient has severe unremitting pain that affects sleep, daily activities, and work/hobbies. After pre-op clearance the patient was taken to the operating room on 10/28/2023 and underwent  Procedure(s): ARTHROPLASTY, KNEE, TOTAL.    Patient was given perioperative antibiotics:  Anti-infectives (From admission, onward)    Start     Dose/Rate Route Frequency Ordered Stop   10/28/23 1400  ceFAZolin (ANCEF) IVPB 2g/100 mL premix        2 g 200 mL/hr over 30 Minutes Intravenous Every 6 hours 10/28/23 1145 10/28/23 2210   10/28/23 1245  doxycycline  (VIBRA -TABS) tablet 100 mg  Status:  Discontinued       Note to Pharmacy: To be taken after surgery     100 mg Oral 2 times daily 10/28/23 1145 10/28/23 1151   10/28/23 0827  vancomycin (VANCOCIN) powder  Status:  Discontinued          As needed 10/28/23 0827 10/28/23 0949   10/28/23 0615  ceFAZolin (ANCEF) IVPB 2g/100 mL premix        2 g 200 mL/hr over 30 Minutes Intravenous On call to O.R. 10/28/23 0601 10/28/23 9247        Patient was given sequential compression devices, early ambulation, and chemoprophylaxis to  prevent DVT.  Inpatient Morphine Milligram Equivalents Per Day 10/10 - 10/12   Values displayed are in units of MME/Day    Order Start / End Date 10/10 Yesterday Today    oxyCODONE  (Oxy IR/ROXICODONE ) immediate release tablet 5 mg 10/10 - 10/10 0 of Unknown -- --    oxyCODONE  (ROXICODONE ) 5 MG/5ML solution 5 mg 10/10 - 10/10 0 of Unknown -- --      Group total: 0 of Unknown      fentaNYL (SUBLIMAZE) injection 25-50 mcg 10/10 - 10/10 45 of 45-90 -- --    fentaNYL (SUBLIMAZE) injection 10/10 - 10/10 *60 of 60 -- --    oxyCODONE  (Oxy IR/ROXICODONE ) immediate release tablet 5 mg 10/10 - No end date 0 of 22.5 0 of 30 0 of 30    oxyCODONE  (Oxy IR/ROXICODONE ) immediate release tablet 10 mg 10/10 - No end date 30 of 45 45 of 60 15 of 60    HYDROmorphone (DILAUDID) injection 1 mg 10/10 - No end date 0 of 40 0 of 60 0 of 60    oxyCODONE  (OXYCONTIN ) 12 hr tablet 10 mg 10/10 - No end date 30 of 30 30 of 30 0 of 30    Daily Totals  * 165 of Unknown (at least 242.5-287.5) 75 of 180 15 of 180  *One-Step medication  Calculation Errors     Order Type Date Details  oxyCODONE  (Oxy IR/ROXICODONE ) immediate release tablet 5 mg Ordered Dose -- Insufficient frequency information   oxyCODONE  (ROXICODONE ) 5 MG/5ML solution 5 mg Ordered Dose -- Insufficient frequency information            Patient benefited maximally from hospital stay and there were no complications.    Recent vital signs: Patient Vitals for the past 24 hrs:  BP Temp Temp src Pulse Resp SpO2  10/30/23 0645 (!) 149/70 99.2 F (37.3 C) -- 88 18 94 %  10/29/23 2154 (!) 170/64 99.5 F (37.5 C) -- 100 18 97 %  10/29/23 1631 (!) 169/74 98.5 F (36.9 C) -- 95 17 95 %  10/29/23 1224 (!) 191/71 99.5 F (37.5 C) Oral 79 17 98 %  10/29/23 0900 130/61 98 F (36.7 C) Oral 86 17 97 %     Recent laboratory studies:  Recent Labs    10/29/23 0654 10/30/23 0315  INR 1.1 1.4*     Discharge Medications:   Allergies as of 10/30/2023        Reactions   Amlodipine Cough   Nifedipine Swelling   Codeine Hives   Hallucinate   Ibuprofen  Hives   Lisinopril Swelling   Morphine And Codeine Itching   Tylenol  [acetaminophen ] Hives        Medication List     TAKE these medications    cyanocobalamin 500 MCG tablet Commonly known as: VITAMIN B12 Take 500 mcg by mouth daily.   docusate sodium  100 MG capsule Commonly known as: Colace Take 1 capsule (100 mg total) by mouth daily as needed.   doxycycline  100 MG tablet Commonly known as: VIBRA -TABS Take 1 tablet (100 mg total) by mouth 2 (two) times daily. To be taken after surgery   enoxaparin 100 MG/ML injection Commonly known as: LOVENOX Inject 100 mg into the skin every 12 (twelve) hours.   fluticasone 50 MCG/ACT nasal spray Commonly known as: FLONASE Place 2 sprays into both nostrils in the morning and at bedtime.   loratadine 10 MG tablet Commonly known as: CLARITIN Take 10 mg by mouth daily as needed for allergies.   losartan 100 MG tablet Commonly known as: COZAAR Take 100 mg by mouth daily.   methocarbamol  500 MG tablet Commonly known as: ROBAXIN  Take 1 tablet (500 mg total) by mouth 2 (two) times daily as needed. What changed: reasons to take this   ondansetron  4 MG tablet Commonly known as: Zofran  Take 1 tablet (4 mg total) by mouth every 8 (eight) hours as needed for nausea or vomiting.   oxyCODONE  5 MG immediate release tablet Commonly known as: Roxicodone  Take 1-2 tablets (5-10 mg total) by mouth 3 (three) times daily with meals as needed. To be taken after surgery What changed:  how much to take when to take this additional instructions   pregabalin 25 MG capsule Commonly known as: LYRICA Take 25 mg by mouth in the morning, at noon, in the evening, and at bedtime.   warfarin 7.5 MG tablet Commonly known as: COUMADIN Take 3.5-7.5 mg by mouth See admin instructions. Take 3.75 mg on Monday and Wednesday and take 7.5 mg on Tuesday,  Thursday-Sunday               Durable Medical Equipment  (From admission, onward)           Start     Ordered   10/28/23 1146  DME Walker rolling  Once       Question Answer Comment  Walker: With  5 Inch Wheels   Patient needs a walker to treat with the following condition Status post left partial knee replacement      10/28/23 1145   10/28/23 1146  DME 3 n 1  Once        10/28/23 1145   10/28/23 1146  DME Bedside commode  Once       Question:  Patient needs a bedside commode to treat with the following condition  Answer:  Status post left partial knee replacement   10/28/23 1145            Diagnostic Studies: DG Knee Left Port Result Date: 10/28/2023 CLINICAL DATA:  Status post left total knee arthroplasty. EXAM: PORTABLE LEFT KNEE - 1-2 VIEW COMPARISON:  08/23/2023. FINDINGS: Status post left total knee arthroplasty with normal alignment. No evidence of acute complication. Expected postoperative soft tissue swelling and soft tissue and intra-articular air. Anterior drain in place. IMPRESSION: Status post left total knee arthroplasty with normal alignment. No evidence of acute complication. Electronically Signed   By: Harrietta Sherry M.D.   On: 10/28/2023 14:03    Disposition: Discharge disposition: 01-Home or Self Care          Follow-up Information     Jule Ronal CROME, PA-C. Schedule an appointment as soon as possible for a visit in 2 week(s).   Specialty: Orthopedic Surgery Contact information: [address] [place]  [place] [address] [phone]                  Signed: Ronal CROME Jule 10/30/2023, 7:34 AM

## 2023-10-30 NOTE — Progress Notes (Signed)
 Physical Therapy Treatment Patient Details Name: [place] Hunt MRN: [id number] DOB: 22-Sep-1945 Today's Date: 10/30/2023   History of Present Illness Pt s/p L TKR and with hx of DM, and Fibromyalgia    PT Comments  Pt continues very cooperative and progressing well with mobility.  Pt OOB unassisted, ambulated increased distance in hall, negotiated stairs, performed HEP with written instruction provided and reviewed, and with questions asked and answered.  Pt eager for dc home this date.    If plan is discharge home, recommend the following: A little help with walking and/or transfers;A little help with bathing/dressing/bathroom;Assistance with cooking/housework;Assist for transportation;Help with stairs or ramp for entrance   Can travel by private vehicle        Equipment Recommendations  Rolling walker (2 wheels)    Recommendations for Other Services       Precautions / Restrictions Precautions Precautions: Fall;Knee Restrictions Weight Bearing Restrictions Per Provider Order: Yes LLE Weight Bearing Per Provider Order: Weight bearing as tolerated     Mobility  Bed Mobility Overal bed mobility: Needs Assistance Bed Mobility: Supine to Sit     Supine to sit: Supervision     General bed mobility comments: cues for sequence and use of R LE to self assist; pt self assisting L LE with gait belt    Transfers Overall transfer level: Needs assistance Equipment used: Rolling walker (2 wheels) Transfers: Sit to/from Stand Sit to Stand: Contact guard assist, Supervision           General transfer comment: cues for LE management and use of UEs to self assist    Ambulation/Gait Ambulation/Gait assistance: Contact guard assist, Supervision Gait Distance (Feet): 100 Feet Assistive device: Rolling walker (2 wheels) Gait Pattern/deviations: Step-to pattern, Decreased step length - right, Decreased step length - left, Shuffle, Trunk flexed Gait velocity: decr     General  Gait Details: cues for posture and position from RW   Stairs Stairs: Yes Stairs assistance: Min assist Stair Management: One rail Right, Step to pattern, Forwards, With cane Number of Stairs: 5 General stair comments: 2+3 stairs with cues for sequence and cane placement   Wheelchair Mobility     Tilt Bed    Modified Rankin (Stroke Patients Only)       Balance Overall balance assessment: Needs assistance Sitting-balance support: No upper extremity supported Sitting balance-Leahy Scale: Good     Standing balance support: No upper extremity supported Standing balance-Leahy Scale: Fair                              Hotel manager: No apparent difficulties  Cognition Arousal: Alert Behavior During Therapy: WFL for tasks assessed/performed   PT - Cognitive impairments: No apparent impairments                         Following commands: Intact      Cueing Cueing Techniques: Verbal cues  Exercises Total Joint Exercises Ankle Circles/Pumps: AROM, Both, 15 reps, Supine Quad Sets: AROM, Both, 10 reps, Supine Heel Slides: AAROM, Left, 15 reps, Supine Straight Leg Raises: AAROM, AROM, Left, 15 reps, Supine    General Comments        Pertinent Vitals/Pain Pain Assessment Pain Assessment: 0-10 Pain Score: 4  Pain Location: L knee Pain Descriptors / Indicators: Aching, Sore Pain Intervention(s): Limited activity within patient's tolerance, Monitored during session, Premedicated before session, Ice applied    Home Living  Prior Function            PT Goals (current goals can now be found in the care plan section) Acute Rehab PT Goals Patient Stated Goal: REgain IND PT Goal Formulation: With patient Time For Goal Achievement: 11/04/23 Potential to Achieve Goals: Good Progress towards PT goals: Progressing toward goals    Frequency    7X/week      PT Plan       Co-evaluation              AM-PAC PT 6 Clicks Mobility   Outcome Measure  Help needed turning from your back to your side while in a flat bed without using bedrails?: A Little Help needed moving from lying on your back to sitting on the side of a flat bed without using bedrails?: A Little Help needed moving to and from a bed to a chair (including a wheelchair)?: A Little Help needed standing up from a chair using your arms (e.g., wheelchair or bedside chair)?: A Little Help needed to walk in hospital room?: A Little Help needed climbing 3-5 steps with a railing? : A Little 6 Click Score: 18    End of Session Equipment Utilized During Treatment: Gait belt Activity Tolerance: Patient tolerated treatment well Patient left: in chair;with call bell/phone within reach;with chair alarm set Nurse Communication: Mobility status PT Visit Diagnosis: Difficulty in walking, not elsewhere classified (R26.2)     Time: 0823-0908 PT Time Calculation (min) (ACUTE ONLY): 45 min  Charges:    $Gait Training: 8-22 mins $Therapeutic Exercise: 8-22 mins $Therapeutic Activity: 8-22 mins PT General Charges $$ ACUTE PT VISIT: 1 Visit                     [facility] PT Acute Rehabilitation Services Office [phone]    Adrianna Dudas 10/30/2023, 9:11 AM

## 2023-10-31 ENCOUNTER — Encounter (HOSPITAL_COMMUNITY): Payer: Self-pay | Admitting: Orthopaedic Surgery

## 2023-11-01 ENCOUNTER — Telehealth: Payer: Self-pay | Admitting: Physician Assistant

## 2023-11-01 NOTE — Telephone Encounter (Signed)
 Called pt and left vm that pt need to r/s post op appt with Jule. Provider will  ot be in office on 10/24. First available with Jerri or Morna

## 2023-11-03 ENCOUNTER — Other Ambulatory Visit: Payer: Self-pay | Admitting: Physician Assistant

## 2023-11-04 ENCOUNTER — Telehealth: Payer: Self-pay | Admitting: Orthopaedic Surgery

## 2023-11-04 NOTE — Telephone Encounter (Signed)
 Received vm from patient to refax [employer] forms. IC patient, advised they were faxed 10/05/23 and refaxed just now [phone] receiving confirmation. Copy also emailed to patient at [email or web address]

## 2023-11-07 ENCOUNTER — Telehealth: Payer: Self-pay | Admitting: Orthopaedic Surgery

## 2023-11-07 ENCOUNTER — Other Ambulatory Visit: Payer: Self-pay | Admitting: Physician Assistant

## 2023-11-07 MED ORDER — OXYCODONE HCL 5 MG PO TABS: 5.0000 mg | ORAL_TABLET | Freq: Three times a day (TID) | ORAL | 0 refills | Status: DC | PRN

## 2023-11-07 NOTE — Telephone Encounter (Signed)
 sent

## 2023-11-07 NOTE — Telephone Encounter (Signed)
Patient called. She would like a refill on oxycodone.

## 2023-11-09 ENCOUNTER — Telehealth: Payer: Self-pay | Admitting: Orthopaedic Surgery

## 2023-11-09 NOTE — Telephone Encounter (Signed)
 Angie from [facility] called with verbal orders. PT 2 wk1, 3 wk 2, 2wk 3 and then 3 wk 1. She do have trouble with transportation so if you want her as outpatient then they can discharge her. Also they want at least 3 of the orders for them. [phone]

## 2023-11-09 NOTE — Telephone Encounter (Signed)
 Notified therapist that Dr.Xu agrees with plan of care.

## 2023-11-11 ENCOUNTER — Ambulatory Visit (INDEPENDENT_AMBULATORY_CARE_PROVIDER_SITE_OTHER): Admitting: Physician Assistant

## 2023-11-11 ENCOUNTER — Encounter: Payer: Self-pay | Admitting: Physician Assistant

## 2023-11-11 DIAGNOSIS — Z96652 Presence of left artificial knee joint: Secondary | ICD-10-CM

## 2023-11-11 MED ORDER — METHOCARBAMOL 500 MG PO TABS
500.0000 mg | ORAL_TABLET | Freq: Two times a day (BID) | ORAL | 2 refills | Status: AC | PRN
Start: 2023-11-11 — End: ?

## 2023-11-11 NOTE — Progress Notes (Signed)
   Post-Op Visit Note   Patient: Virginia Hunt           Date of Birth: Dec 22, 1945           MRN: [id number] Visit Date: 11/11/2023 PCP: Jacquelyn Jaynie Player, PA-C   Assessment & Plan:  Chief Complaint:  Chief Complaint  Patient presents with   Left Knee - Follow-up    Left TKA 10/28/23   Visit Diagnoses:  1. Status post total left knee replacement     Plan: Patient is a very pleasant 78 year old female who comes in today 2 weeks status post left total knee replacement.  She has been doing relatively well.  She has been getting home health PT and is ambulating with a walker.  She is taking oxycodone  and Robaxin  for pain.  She is on chronic Coumadin.  Examination of the left knee reveals well-healing surgical incision with nylon sutures in place.  No evidence of infection or cellulitis.  Calves are soft nontender.  She has moderate swelling the left lower extremity.  She is neurovascularly intact distally.  Today, sutures were removed and Steri-Strips applied.  I have refilled her Robaxin .  I sent in a referral for outpatient PT in [place].  I have recommended that she use start wearing her compression socks again but they take these off at night.  Follow-up with us  in 4 weeks for repeat evaluation and 2 view x-rays of the left knee.  Call with concerns or questions.  Follow-Up Instructions: Return in about 4 weeks (around 12/09/2023) for with Dr. Jerri.   Orders:  Orders Placed This Encounter  Procedures   Ambulatory referral to Physical Therapy   Meds ordered this encounter  Medications   methocarbamol  (ROBAXIN ) 500 MG tablet    Sig: Take 1 tablet (500 mg total) by mouth 2 (two) times daily as needed.    Dispense:  20 tablet    Refill:  2    Imaging: No results found.  PMFS History: Patient Active Problem List   Diagnosis Date Noted   Primary osteoarthritis of left knee 10/28/2023   Status post total left knee replacement 10/28/2023   Past Medical History:   Diagnosis Date   Arthritis    Asthma    Cancer (HCC)    Carpal tunnel syndrome    Complication of anesthesia    Diabetes mellitus without complication (HCC)    pre diabetes   Diverticulitis    Dyspnea    Fibromyalgia    Hypertension     Family History  Problem Relation Age of Onset   Stroke Father    COPD Father     Past Surgical History:  Procedure Laterality Date   ABDOMINAL HYSTERECTOMY     CHOLECYSTECTOMY     KNEE SURGERY     TONSILLECTOMY     TOTAL KNEE ARTHROPLASTY Left 10/28/2023   Procedure: ARTHROPLASTY, KNEE, TOTAL;  Surgeon: Jerri Kay HERO, MD;  Location: [facility];  Service: Orthopedics;  Laterality: Left;   Social History   Occupational History   Not on file  Tobacco Use   Smoking status: Former   Smokeless tobacco: Never  Vaping Use   Vaping status: Never Used  Substance and Sexual Activity   Alcohol use: No   Drug use: No   Sexual activity: Not on file

## 2023-11-18 ENCOUNTER — Telehealth: Payer: Self-pay | Admitting: Orthopaedic Surgery

## 2023-11-18 ENCOUNTER — Other Ambulatory Visit: Payer: Self-pay | Admitting: Physician Assistant

## 2023-11-18 MED ORDER — OXYCODONE HCL 5 MG PO TABS: 5.0000 mg | ORAL_TABLET | Freq: Two times a day (BID) | ORAL | 0 refills | Status: DC | PRN

## 2023-11-18 NOTE — Telephone Encounter (Signed)
 Patient called. She would like oxycodone called in for her.

## 2023-11-21 ENCOUNTER — Encounter: Payer: Self-pay | Admitting: Radiology

## 2023-11-30 ENCOUNTER — Other Ambulatory Visit: Payer: Self-pay | Admitting: Physician Assistant

## 2023-11-30 ENCOUNTER — Telehealth: Payer: Self-pay | Admitting: Orthopaedic Surgery

## 2023-11-30 MED ORDER — OXYCODONE HCL 5 MG PO TABS: 5.0000 mg | ORAL_TABLET | Freq: Two times a day (BID) | ORAL | 0 refills | Status: DC | PRN

## 2023-11-30 NOTE — Telephone Encounter (Signed)
 Patient called and said she needs a refill on Oxycodone . CB#[phone]

## 2023-11-30 NOTE — Telephone Encounter (Signed)
 sent

## 2023-12-01 NOTE — Telephone Encounter (Signed)
 Patient aware

## 2023-12-09 ENCOUNTER — Telehealth: Payer: Self-pay | Admitting: Orthopaedic Surgery

## 2023-12-09 MED ORDER — HYDROCODONE-ACETAMINOPHEN 5-325 MG PO TABS
1.0000 | ORAL_TABLET | Freq: Every day | ORAL | 0 refills | Status: AC | PRN
Start: 2023-12-09 — End: ?

## 2023-12-09 NOTE — Telephone Encounter (Signed)
 I sent norco

## 2023-12-09 NOTE — Telephone Encounter (Signed)
 Pt called saying that hey need a refill of Oxycodone  5 mg. Pharmacy is [facility] on [address] back number is [phone]

## 2023-12-12 NOTE — Progress Notes (Signed)
 Physical Therapy Physical Therapy Contact Note  Patient Name:  Virginia Hunt  Patient reports receiving L TKR in October 2025. Experiencing ongoing neuropathy and increasing pain from neuropathy and fibromyalgia. Is currently receiving HHPT for knee rehabilitation. Seeing orthopedic surgeon tomorrow for updates on knee replacement. Planning to be fit for compression garments in future. Does have PCD from [facility].   Considering thigh-high compression for swelling management.   Not appropriate for outpatient evaluation today due to participation in home health PT services. Will schedule follow up for evaluation and treatment for lymphedema at a later date.   Michelle Dellascio DeCastro, PT DPT, CLT

## 2023-12-13 ENCOUNTER — Other Ambulatory Visit (INDEPENDENT_AMBULATORY_CARE_PROVIDER_SITE_OTHER): Payer: Self-pay

## 2023-12-13 ENCOUNTER — Ambulatory Visit: Admitting: Orthopaedic Surgery

## 2023-12-13 DIAGNOSIS — Z96652 Presence of left artificial knee joint: Secondary | ICD-10-CM

## 2023-12-13 MED ORDER — OXYCODONE HCL 5 MG PO TABS: 5.0000 mg | ORAL_TABLET | Freq: Two times a day (BID) | ORAL | 0 refills | Status: AC | PRN

## 2023-12-13 NOTE — Progress Notes (Signed)
   Post-Op Visit Note   Patient: Virginia Hunt           Date of Birth: [birth year]           MRN: [id number] Visit Date: 12/13/2023 PCP: Jacquelyn Jaynie Player, PA-C   Assessment & Plan:  Chief Complaint:  Chief Complaint  Patient presents with   Left Knee - Follow-up    S/p Left TKA 10/28/2023   Visit Diagnoses:  1. Status post total left knee replacement     Plan: History of Present Illness Raevyn Fahmida Jurich is a 78 year old female with lymphedema and fibromyalgia who presents for 6 week follow-up after left knee replacement surgery.  She notes increased swelling in the leg today, which she relates to her lymphedema. She is receiving in-home physical therapy after her knee replacement.  She has had medication issues. She was mistakenly prescribed hydrocodone  with acetaminophen , which causes itching, and she prefers to continue oxycodone , which she has used for several years for fibromyalgia pain.  Physical Exam MUSCULOSKELETAL: Right knee flexion and extension limited due to lymphedema.  Assessment and Plan Status post left total knee arthroplasty Six weeks post-surgery with good range of motion progress, limited by lymphedema. No pain. Implant stable on imaging. - Prescribed outpatient physical therapy for four weeks, once a week, at [facility]. - Scheduled follow-up in six weeks.  Follow-Up Instructions: Return in about 6 weeks (around 01/24/2024) for with lindsey.   Orders:  Orders Placed This Encounter  Procedures   XR Knee 1-2 Views Left   No orders of the defined types were placed in this encounter.   Imaging: No results found.  PMFS History: Patient Active Problem List   Diagnosis Date Noted   Primary osteoarthritis of left knee 10/28/2023   Status post total left knee replacement 10/28/2023   Past Medical History:  Diagnosis Date   Arthritis    Asthma    Cancer (HCC)    Carpal tunnel syndrome    Complication of anesthesia     Diabetes mellitus without complication (HCC)    pre diabetes   Diverticulitis    Dyspnea    Fibromyalgia    Hypertension     Family History  Problem Relation Age of Onset   Stroke Father    COPD Father     Past Surgical History:  Procedure Laterality Date   ABDOMINAL HYSTERECTOMY     CHOLECYSTECTOMY     KNEE SURGERY     TONSILLECTOMY     TOTAL KNEE ARTHROPLASTY Left 10/28/2023   Procedure: ARTHROPLASTY, KNEE, TOTAL;  Surgeon: Jerri Kay HERO, MD;  Location: [facility];  Service: Orthopedics;  Laterality: Left;   Social History   Occupational History   Not on file  Tobacco Use   Smoking status: Former   Smokeless tobacco: Never  Vaping Use   Vaping status: Never Used  Substance and Sexual Activity   Alcohol  use: No   Drug use: No   Sexual activity: Not on file

## 2023-12-19 ENCOUNTER — Telehealth: Payer: Self-pay | Admitting: Orthopaedic Surgery

## 2023-12-19 NOTE — Telephone Encounter (Signed)
 yes

## 2023-12-19 NOTE — Telephone Encounter (Signed)
 Virginia Hunt (PT) from [facility] called requesting extension of PT for discharge for today's date. Virginia Hunt secure number is [phone].

## 2023-12-19 NOTE — Telephone Encounter (Signed)
 Called and notified with verbal okay via voicemail.

## 2024-01-25 ENCOUNTER — Ambulatory Visit: Admitting: Physician Assistant

## 2024-01-25 DIAGNOSIS — Z96652 Presence of left artificial knee joint: Secondary | ICD-10-CM

## 2024-01-25 NOTE — Progress Notes (Signed)
" ° °  Post-Op Visit Note   Patient: Virginia Hunt           Date of Birth: 1945/08/08           MRN: [id number] Visit Date: 01/25/2024 PCP: Jacquelyn Jaynie Player, PA-C   Assessment & Plan:  Chief Complaint: No chief complaint on file.  Visit Diagnoses:  1. Status post total left knee replacement     Plan: Patient is a pleasant 79 year old female who comes in today 3 months status post left total knee replacement.  She has been doing better.  She was initially getting home health physical therapy but has been unable to start outpatient physical therapy.  She does tell me she has her first evaluation for outpatient PT tomorrow.  She has not had any therapy at all since 1 December.  She is on oxycodone  chronically which is being prescribed by her PCP.  Examination of her left knee reveals range of motion from 5 to 90 degrees.  She is stable to valgus varus stress.  She is neurovascular intact distally.  At this point, she will continue to push things at home as well as with formal physical therapy when she starts tomorrow.  Follow-up in 3 months for repeat evaluation and 2 view x-rays of the left knee.  Call with concerns or questions.  Follow-Up Instructions: Return in about 3 months (around 04/24/2024).   Orders:  No orders of the defined types were placed in this encounter.  No orders of the defined types were placed in this encounter.   Imaging: No results found.  PMFS History: Patient Active Problem List   Diagnosis Date Noted   Primary osteoarthritis of left knee 10/28/2023   Status post total left knee replacement 10/28/2023   Past Medical History:  Diagnosis Date   Arthritis    Asthma    Cancer (HCC)    Carpal tunnel syndrome    Complication of anesthesia    Diabetes mellitus without complication (HCC)    pre diabetes   Diverticulitis    Dyspnea    Fibromyalgia    Hypertension     Family History  Problem Relation Age of Onset   Stroke Father    COPD Father      Past Surgical History:  Procedure Laterality Date   ABDOMINAL HYSTERECTOMY     CHOLECYSTECTOMY     KNEE SURGERY     TONSILLECTOMY     TOTAL KNEE ARTHROPLASTY Left 10/28/2023   Procedure: ARTHROPLASTY, KNEE, TOTAL;  Surgeon: Jerri Kay HERO, MD;  Location: [facility];  Service: Orthopedics;  Laterality: Left;   Social History   Occupational History   Not on file  Tobacco Use   Smoking status: Former   Smokeless tobacco: Never  Vaping Use   Vaping status: Never Used  Substance and Sexual Activity   Alcohol  use: No   Drug use: No   Sexual activity: Not on file     "

## 2024-04-24 ENCOUNTER — Ambulatory Visit: Admitting: Physician Assistant
